# Patient Record
Sex: Female | Born: 1995
Health system: Southern US, Community
[De-identification: ages and names within clinical notes are randomized; demographics above are authoritative.]

## PROBLEM LIST (undated history)

## (undated) DIAGNOSIS — N390 Urinary tract infection, site not specified: Secondary | ICD-10-CM

## (undated) DIAGNOSIS — H539 Unspecified visual disturbance: Secondary | ICD-10-CM

## (undated) DIAGNOSIS — T7840XA Allergy, unspecified, initial encounter: Secondary | ICD-10-CM

## (undated) HISTORY — DX: Unspecified visual disturbance: H53.9

## (undated) HISTORY — DX: Urinary tract infection, site not specified: N39.0

## (undated) HISTORY — DX: Allergy, unspecified, initial encounter: T78.40XA

---
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
## 2016-01-01 ENCOUNTER — Other Ambulatory Visit: Payer: Self-pay | Admitting: Pediatrics

## 2016-01-02 ENCOUNTER — Telehealth: Payer: Self-pay | Admitting: Cardiology

## 2016-01-02 NOTE — Telephone Encounter (Signed)
Patients Mother needs call back-needs letter stating Echo was good and she is cleared-she is leaving for college Monday-please call [phone]

## 2016-01-05 ENCOUNTER — Encounter: Payer: Self-pay | Admitting: Cardiology

## 2016-01-05 ENCOUNTER — Encounter: Payer: Self-pay | Admitting: *Deleted

## 2016-01-05 NOTE — Telephone Encounter (Signed)
To Whom It May Concern,  Ms Betty Davenport evaluated in our clinic and there is currently no contraindication for this young female to start her school program or being involved in any sport activity.  Please call us with any questions.  Warm regards,  Tobias AlexanderKatarina Lujean Ebright Cardiologist [facility]

## 2016-01-05 NOTE — Telephone Encounter (Signed)
Informed the pts Mother, Lorene DyChristie, that Dr Delton SeeNelson wrote this pts clearance letter for school, and this is available for her to pick up at the front desk at her earliest convenience.  Mother verbalized understanding and agrees with this plan.  Mother gracious for all the assistance provided.

## 2016-01-05 NOTE — Telephone Encounter (Signed)
Will route this letter request to Dr Delton SeeNelson to review and advise on and follow-up with the pt and Mother thereafter.

## 2016-01-20 NOTE — Progress Notes (Signed)
I wrote her letter for school already

## 2016-01-25 ENCOUNTER — Other Ambulatory Visit: Payer: Self-pay | Admitting: Pediatrics

## 2016-04-19 ENCOUNTER — Other Ambulatory Visit: Payer: Self-pay | Admitting: Pediatrics

## 2016-06-16 ENCOUNTER — Other Ambulatory Visit: Payer: Self-pay | Admitting: Pediatrics

## 2016-10-26 ENCOUNTER — Other Ambulatory Visit: Payer: Self-pay | Admitting: Pediatrics

## 2016-10-27 NOTE — Telephone Encounter (Signed)
Needs appointment for further refills

## 2016-12-18 ENCOUNTER — Other Ambulatory Visit: Payer: Self-pay | Admitting: Pediatrics

## 2017-05-03 IMAGING — DX DG CHEST 2V
2 series · 2 of 2 positions shown · non-contrast
Comparison: None in PACs

CLINICAL DATA: Childhood asthma

EXAM:
CHEST  2 VIEW

[chest pa]
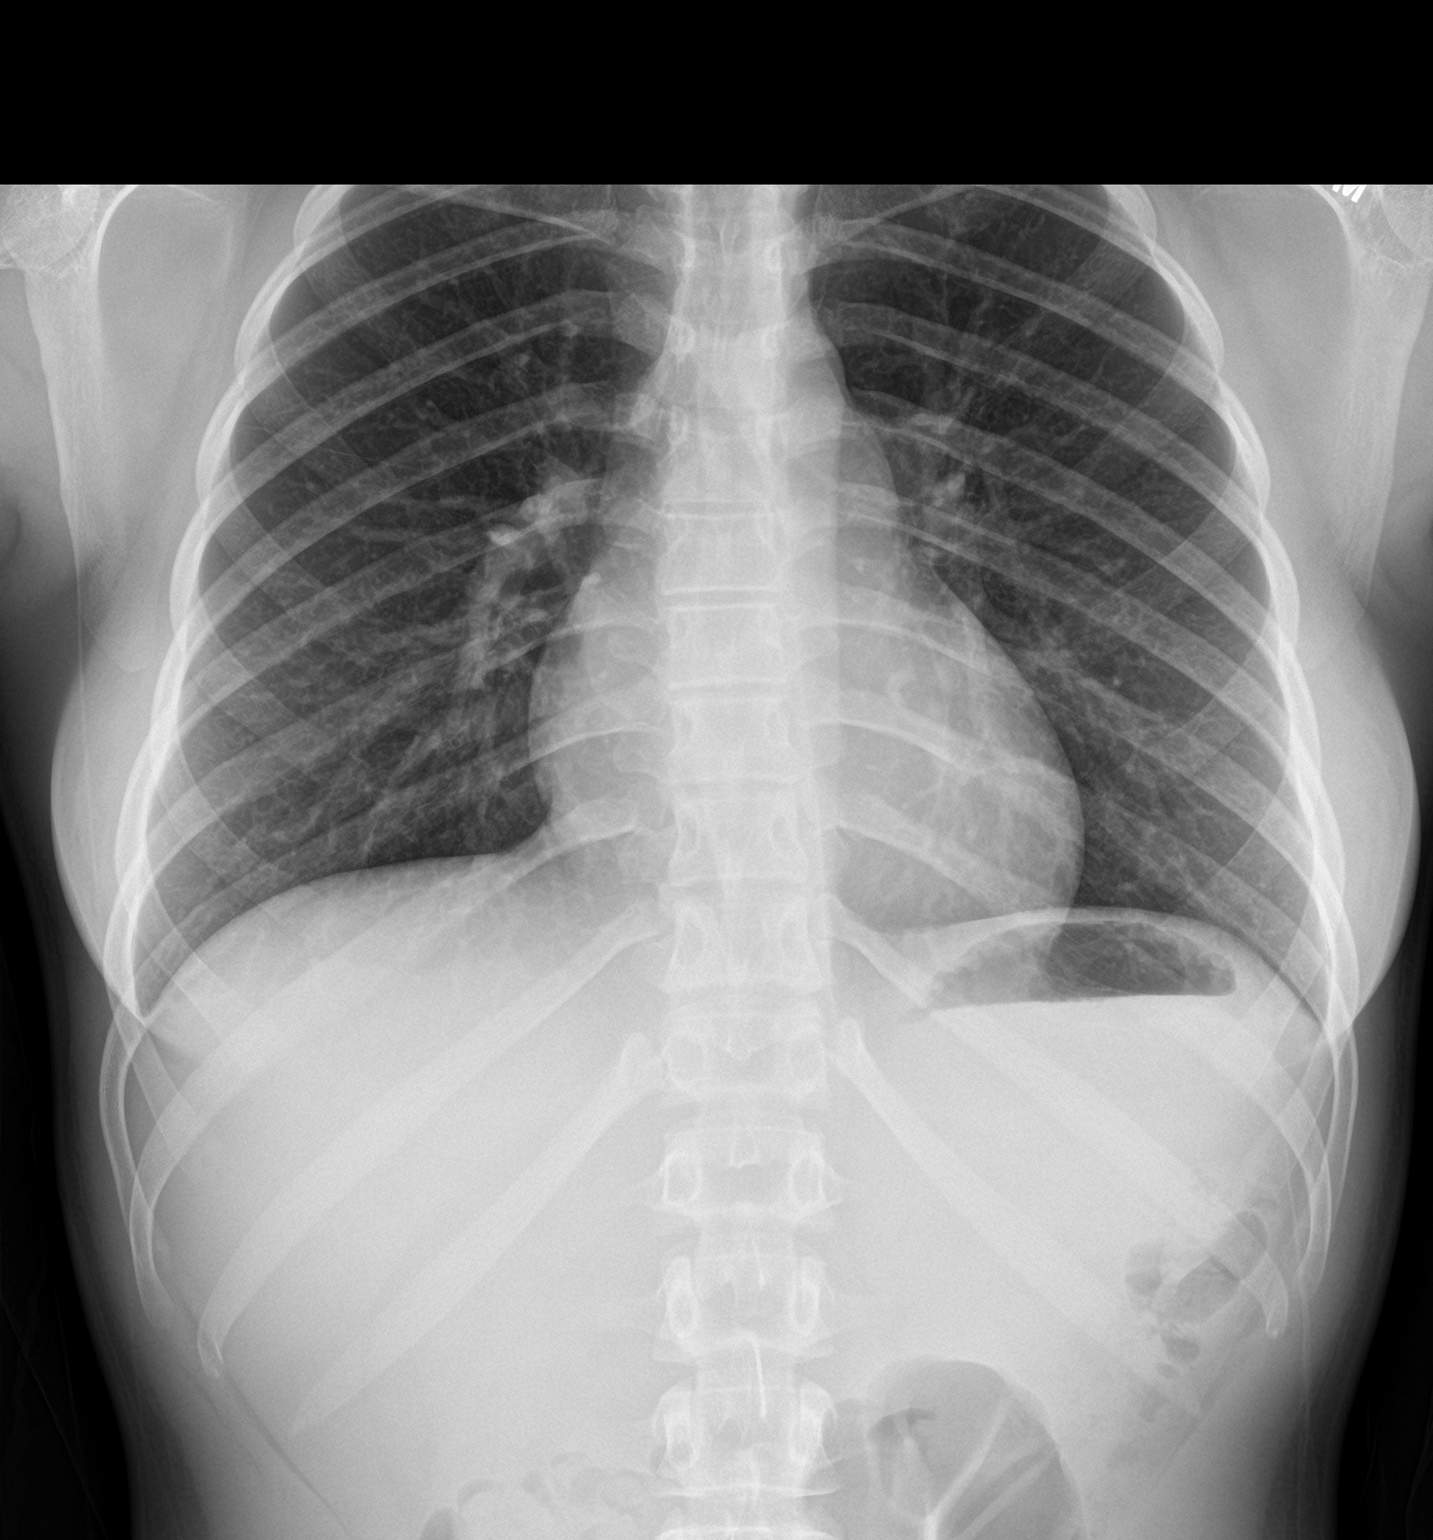

[chest lat]
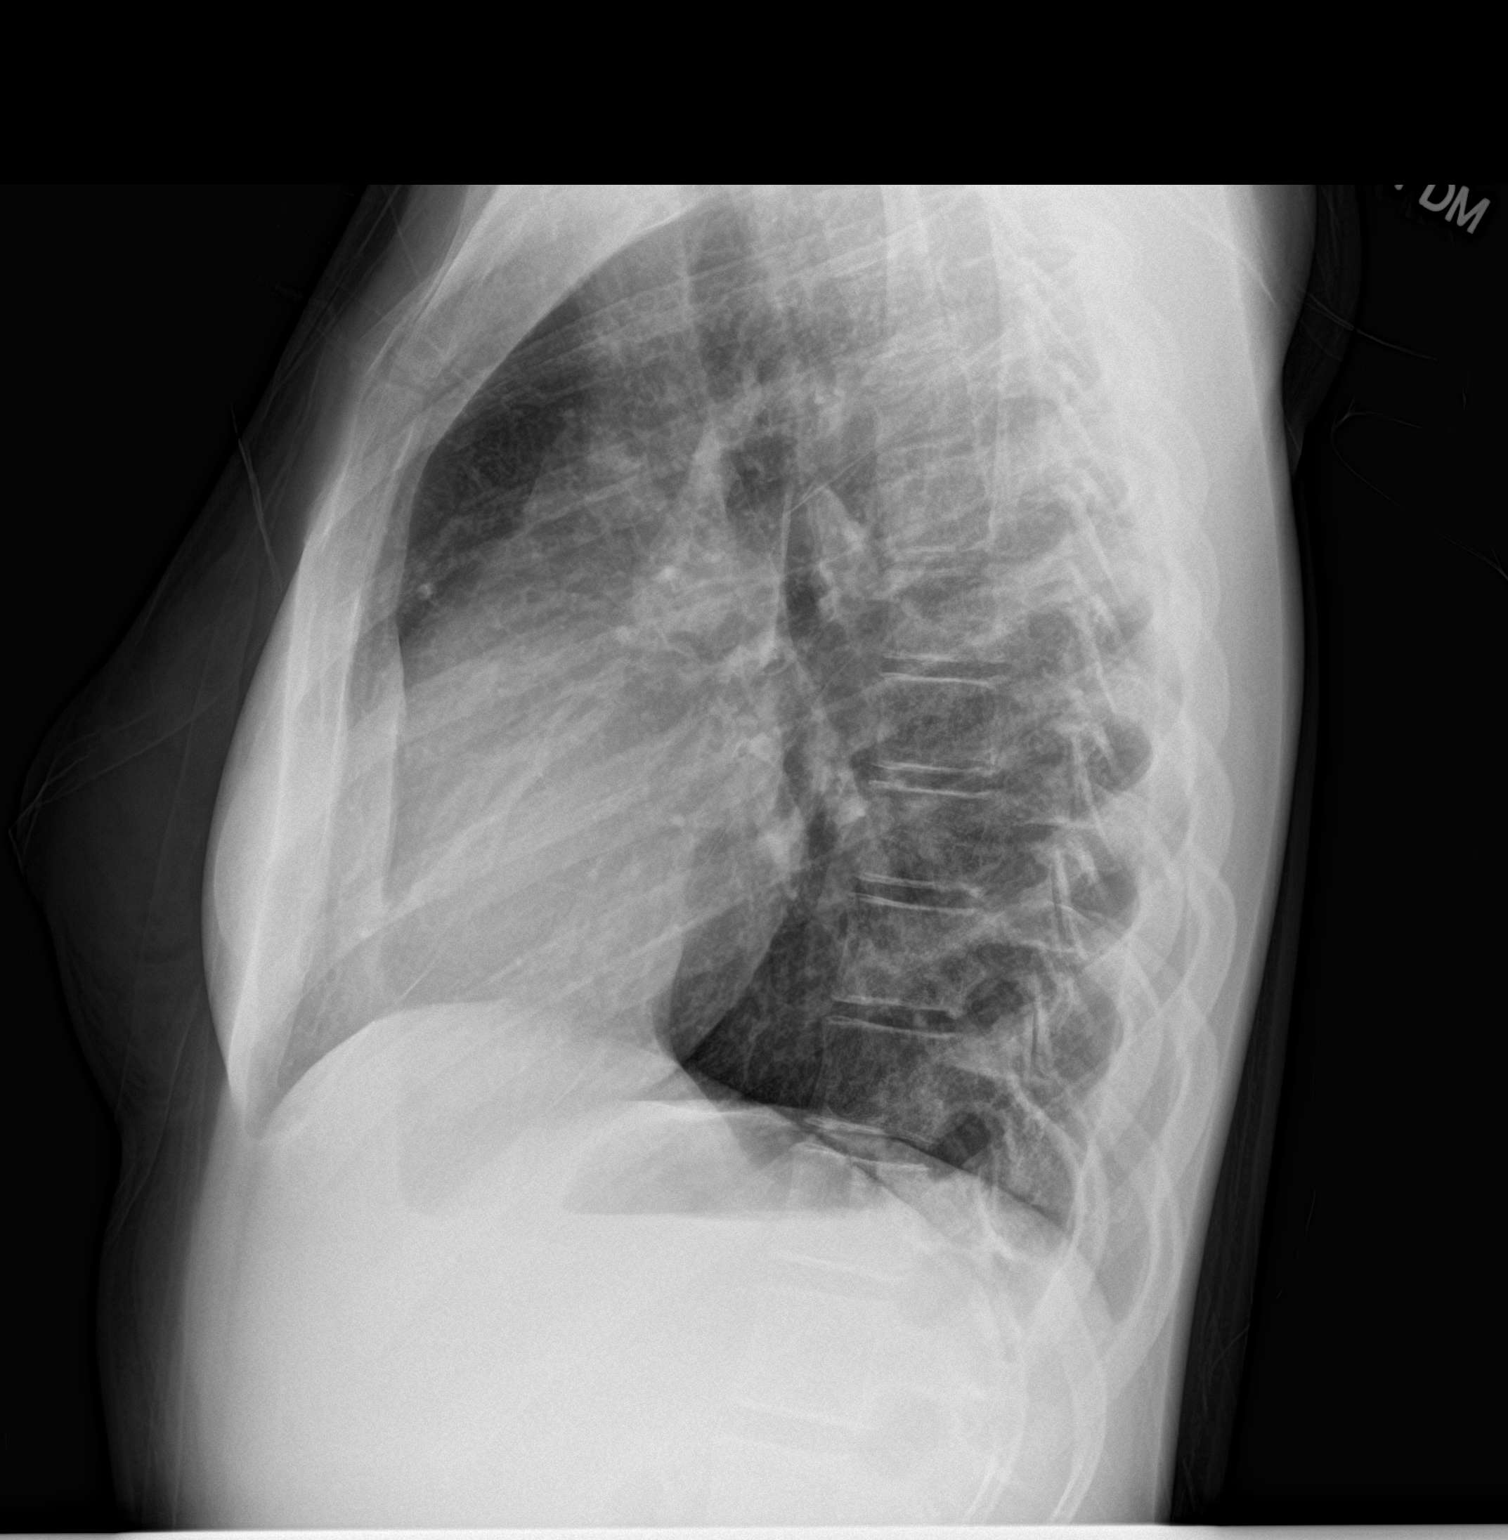

[2 of 2 positions shown; findings below may reference images not displayed]

FINDINGS: The lungs are well-expanded. There is no focal infiltrate. There is
no pleural effusion. The heart and pulmonary vascularity are normal.
The mediastinum is normal in width. The bony thorax is unremarkable.
IMPRESSION: There is no active cardiopulmonary disease.

## 2017-07-19 ENCOUNTER — Ambulatory Visit (INDEPENDENT_AMBULATORY_CARE_PROVIDER_SITE_OTHER): Payer: Self-pay | Admitting: Pediatrics

## 2017-07-19 VITALS — BP 121/72 | HR 86 | Ht 60.24 in | Wt 114.0 lb

## 2017-07-19 DIAGNOSIS — Z3202 Encounter for pregnancy test, result negative: Secondary | ICD-10-CM

## 2017-07-19 DIAGNOSIS — Z113 Encounter for screening for infections with a predominantly sexual mode of transmission: Secondary | ICD-10-CM

## 2017-07-19 DIAGNOSIS — Z3041 Encounter for surveillance of contraceptive pills: Secondary | ICD-10-CM

## 2017-07-19 DIAGNOSIS — K5900 Constipation, unspecified: Secondary | ICD-10-CM

## 2017-07-19 LAB — POCT URINE PREGNANCY: Preg Test, Ur: NEGATIVE

## 2017-07-19 LAB — POCT RAPID HIV: Rapid HIV, POC: NEGATIVE

## 2017-07-19 MED ORDER — POLYETHYLENE GLYCOL 3350 17 GM/SCOOP PO POWD: 1.0000 | Freq: Every day | ORAL | 5 refills | Status: AC

## 2017-07-19 MED ORDER — NORETHIN ACE-ETH ESTRAD-FE 1.5-30 MG-MCG PO TABS: 1.0000 | ORAL_TABLET | Freq: Every day | ORAL | 11 refills | Status: DC

## 2017-07-19 NOTE — Progress Notes (Signed)
THIS RECORD MAY CONTAIN CONFIDENTIAL INFORMATION THAT SHOULD NOT BE RELEASED WITHOUT REVIEW OF THE SERVICE PROVIDER.  Adolescent Medicine Consultation Follow-Up Visit Betty Davenport  is a 22 y.o. female referred by No ref. provider found here today for follow-up.    Previsit planning completed:  yes  Growth Chart Viewed? yes   History was provided by the patient.  PCP Confirmed?  yes  My Chart Activated?   no   HPI:  Betty Davenport is a 22 year old F who presents for constipation and need for birth control refills.   The patient reports that she has been having issues with constipation. Usually stools every other day. Has been taking miralax (1 cap) every other day. When she doesn't take miralax, she stools every other week.  Associated with occassional abdominal pain. No nausea/vomiting. No urinary changes.   She is currently taking OCPs daily. Her cycles have been regular. Occur monthly. Usually last 4-5 days. Heavy bleeding during the first 2 days. No menstrual cramps. She reports running out of OCPs in December 2018. They were prescribed by her PCP.   No LMP recorded. No Known Allergies Outpatient Encounter Medications as of 07/19/2017  Medication Sig  . cetirizine (ZYRTEC) 10 MG tablet TAKE 1 TABLET BY MOUTH EVERY DAY  . cetirizine (ZYRTEC) 10 MG tablet TAKE 1 TABLET BY MOUTH EVERY DAY  . Cholecalciferol (VITAMIN D PO) Take by mouth.  . norethindrone-ethinyl estradiol-iron (JUNEL FE 1.5/30) 1.5-30 MG-MCG tablet Take 1 tablet by mouth daily.  . norethindrone-ethinyl estradiol-iron (JUNEL FE 1.5/30) 1.5-30 MG-MCG tablet Take 1 tablet by mouth daily.  [employer]. OS-CAL CALCIUM + D3 500-200 MG-UNIT TABS TAKE 1 TABLET BY MOUTH DAILY WITH BREAKFAST.  Marland Kitchen. polyethylene glycol powder (GLYCOLAX/MIRALAX) powder Take 255 g by mouth daily.  . [DISCONTINUED] JUNEL FE 1.5/30 1.5-30 MG-MCG tablet TAKE 1 TABLET BY MOUTH DAILY.  . [DISCONTINUED] polyethylene glycol powder (GLYCOLAX/MIRALAX) powder TAKE 17 G BY MOUTH  DAILY.   No facility-administered encounter medications on file as of 07/19/2017.      Patient Active Problem List   Diagnosis Date Noted  . Childhood asthma without complication 12/03/2015  . Vitamin D insufficiency 06/12/2014  . Low serum alkaline phosphatase 02/25/2014  . Endocrine function study abnormality 02/25/2014  . Short stature 02/25/2014  . Allergic rhinitis 04/18/2013  . Family problems 01/20/2011  . Adjustment disorder with depressed mood 01/20/2011    Social History: Lives with: Currently in college and stays on campus. Goes home on holidays. Has a good relationship with brother and mother.   School: [employer] Future Plans:  [employer] Exercise:  goes to fitness center sometimes. Walks on campus  Sports:  none Sleep:  Tries to go to sleep between 11pm - 12 am. Gets about 7 hours of sleep a day. About once a week, she will have issues going to sleep because she has a lot on her mind in regards to school work.   Confidentiality was discussed with the patient and if applicable, with caregiver as well.  Patient's personal or confidential phone number: [phone] Enter confidential phone number in social history in documentation section of social history Tobacco?  no Drugs/ETOH?  no Partner preference?  female Sexually Active?  no  Pregnancy Prevention:  birth control pills, reviewed condoms & plan B Trauma currently or in the past?  no Suicidal or Self-Harm thoughts?   yes, happens about once a month. Talks to her best friend who helps her feel better.Marland Kitchen. Has never formulated  a plan  Guns in the home?  Yes, mom has one for protection     The following portions of the patient's history were reviewed and updated as appropriate: allergies, current medications, past family history, past medical history, past social history, past surgical history and problem list.  Physical Exam:  Vitals:   07/19/17 1512  BP: 121/72  Pulse: 86  Weight: 114 lb  (51.7 kg)  Height: 5' 0.24" (1.53 m)   BP 121/72   Pulse 86   Ht 5' 0.24" (1.53 m)   Wt 114 lb (51.7 kg)   BMI 22.09 kg/m  Body mass index: body mass index is 22.09 kg/m. Growth percentile SmartLinks can only be used for patients less than 70 years old.  Physical Exam  Constitutional: She is oriented to person, place, and time. She appears well-developed and well-nourished.  HENT:  Head: Normocephalic and atraumatic.  Eyes: Conjunctivae and EOM are normal.  Neck: Normal range of motion. Neck supple.  Cardiovascular: Normal rate, regular rhythm, normal heart sounds and intact distal pulses.  No murmur heard. Pulmonary/Chest: Effort normal and breath sounds normal. She has no wheezes. She has no rales.  Abdominal: Soft. Bowel sounds are normal. She exhibits no distension. There is no tenderness. There is no rebound and no guarding.  Musculoskeletal: Normal range of motion. She exhibits no edema.  Lymphadenopathy:    She has no cervical adenopathy.  Neurological: She is alert and oriented to person, place, and time.  Skin: Skin is warm. No rash noted.    Assessment/Plan:  1. Encounter for surveillance of contraceptive pills - Refilled OCPs. Patient is not interested in any other forms of birth control. She is satisfied with OCPs.  - norethindrone-ethinyl estradiol-iron (JUNEL FE 1.5/30) 1.5-30 MG-MCG tablet; Take 1 tablet by mouth daily.  Dispense: 28 tablet; Refill: 11  2. Routine screening for STI (sexually transmitted infection) - POCT Rapid HIV - C. trachomatis/N. gonorrhoeae RNA  3. Pregnancy examination or test, negative result - POCT urine pregnancy  4. Constipation, unspecified constipation type - Refilled miralax and recommended a constipation clean out (provided patient with cleanout information) and recommended a daily maintenance dose of 1-2 caps in 8 oz of water daily for at least 6 months. - Counseled on drinking water and increasing dietary fiber.  - Advised  to follow up with PCP for continued management  - polyethylene glycol powder (GLYCOLAX/MIRALAX) powder; Take 255 g by mouth daily.  Dispense: 255 g; Refill: 5   Follow-up:  Return if symptoms worsen or fail to improve.   Medical decision-making:  > 15 minutes spent, more than 50% of appointment was spent discussing diagnosis and management of symptoms.

## 2017-07-19 NOTE — Patient Instructions (Signed)
You are constipated and need help to clean out the large amount of stool (poop) in the intestine. This guide tells you what medicine to use.  What do I need to know before starting the clean out?  . It will take about 4 to 6 hours to take the medicine.  . After taking the medicine, you should have a large stool within 24 hours.  . Plan to stay close to a bathroom until the stool has passed. . After the intestine is cleaned out, you will need to take a daily medicine.   Remember:  Constipation can last a long time. It may take 6 to 12 months for you to get back to regular bowel movements (BMs). Be patient. Things will get better slowly over time.  If you have questions, call your doctor at this number:     [phone]   When should you start the clean out?  . Start the home clean out on a Friday afternoon or some other time when you will be home (and not at school).  . Start between 2:00 and 4:00 in the afternoon.  . You should have almost clear liquid stools by the end of the next day. . If the medicine does not work or you don't know if it worked, Physicist, medicalcall your doctor or nurse.  What medicine do I need to take?  You need to take Miralax, a powder that you mix in a clear liquid.  Follow these steps: ?    Stir the Miralax powder into water, juice, or Gatorade. Your Miralax dose is: 8 capfuls of Miralax powder in 32-64 ounces of liquid ?    Drink 4 to 8 ounces every 30 minutes. It will take 4 to 6 hours to finish the medicine. ?    After the medicine is gone, drink more water or juice. This will help with the cleanout.   -     If the medicine gives you an upset stomach, slow down or stop.   Does I need to keep taking medicine?                                                                                                      After the clean out, you will take a daily (maintenance) medicine for at least 6 months. Your Miralax dose is:  1-2  capful of powder in 8 ounces of liquid  every day  You should go to the doctor for follow-up appointments as directed.  What if I get constipated again?  Some people need to have the clean out more than one time for the problem to go away. Contact your doctor to ask if you should repeat the clean out. It is OK to do it again, but you should wait at least a week before repeating the clean out.    Will I have any problems with the medicine?   You may have stomach pain or cramping during the clean out. This might mean you have to go to the bathroom.   Take some time  to sit on the toilet. The pain will go away when the stool is gone. You may want to read while you wait. A warm bath may also help.   What should I eat and drink?  Drink lots of water and juice. Fruits and vegetables are good foods to eat. Try to avoid greasy and fatty foods.

## 2017-07-20 LAB — C. TRACHOMATIS/N. GONORRHOEAE RNA
C. TRACHOMATIS RNA, TMA: NOT DETECTED
N. gonorrhoeae RNA, TMA: NOT DETECTED

## 2017-08-22 ENCOUNTER — Other Ambulatory Visit: Payer: Self-pay | Admitting: Pediatrics

## 2018-01-17 DIAGNOSIS — Z0289 Encounter for other administrative examinations: Secondary | ICD-10-CM | POA: Diagnosis not present

## 2018-01-17 DIAGNOSIS — Z130 Encounter for screening for diseases of the blood and blood-forming organs and certain disorders involving the immune mechanism: Secondary | ICD-10-CM | POA: Diagnosis not present

## 2018-03-10 DIAGNOSIS — Z23 Encounter for immunization: Secondary | ICD-10-CM | POA: Diagnosis not present

## 2018-04-29 ENCOUNTER — Encounter (HOSPITAL_COMMUNITY): Payer: Self-pay

## 2018-04-29 ENCOUNTER — Ambulatory Visit (HOSPITAL_COMMUNITY)
Admission: EM | Admit: 2018-04-29 | Discharge: 2018-04-29 | Disposition: A | Discharge status: Home / Self Care | Payer: 59 | Attending: Internal Medicine | Admitting: Internal Medicine

## 2018-04-29 DIAGNOSIS — J029 Acute pharyngitis, unspecified: Secondary | ICD-10-CM | POA: Diagnosis not present

## 2018-04-29 DIAGNOSIS — J039 Acute tonsillitis, unspecified: Secondary | ICD-10-CM

## 2018-04-29 LAB — POCT RAPID STREP A: Streptococcus, Group A Screen (Direct): NEGATIVE

## 2018-04-29 MED ORDER — FLUTICASONE PROPIONATE 50 MCG/ACT NA SUSP: 2.0000 | Freq: Every day | NASAL | 0 refills | Status: DC

## 2018-04-29 MED ORDER — AMOXICILLIN-POT CLAVULANATE 875-125 MG PO TABS: 1.0000 | ORAL_TABLET | Freq: Two times a day (BID) | ORAL | 0 refills | Status: DC

## 2018-04-29 MED ORDER — IPRATROPIUM BROMIDE 0.06 % NA SOLN: 2.0000 | Freq: Four times a day (QID) | NASAL | 0 refills | Status: DC

## 2018-04-29 NOTE — ED Provider Notes (Signed)
[facility]    CSN: [id number] Arrival date & time: 04/29/18  1051     History   Chief Complaint Chief Complaint  Patient presents with  . Sore Throat    HPI Betty Davenport is a 22 y.o. female.   22 year old female comes in for a week history of URI symptoms.  States started out with rhinorrhea, nasal congestion, cough, sore throat.  Had fever that has since resolved.  Other symptoms have slowly been improving, but has had increase in sore throat.  States noticed white patches to the tonsils.  She denies trouble breathing, trouble swallowing, trismus, tripoding, drooling.  OTC cold medication without relief.  No obvious sick contact.  Never smoker.     Past Medical History:  Diagnosis Date  . Allergy   . Urinary tract infection    3 months old  . Vision abnormalities    Farsighted    Patient Active Problem List   Diagnosis Date Noted  . Childhood asthma without complication 12/03/2015  . Vitamin D insufficiency 06/12/2014  . Low serum alkaline phosphatase 02/25/2014  . Endocrine function study abnormality 02/25/2014  . Short stature 02/25/2014  . Allergic rhinitis 04/18/2013  . Family problems 01/20/2011  . Adjustment disorder with depressed mood 01/20/2011    History reviewed. No pertinent surgical history.  OB History   None      Home Medications    Prior to Admission medications   Medication Sig Start Date End Date Taking? Authorizing Provider  amoxicillin-clavulanate (AUGMENTIN) 875-125 MG tablet Take 1 tablet by mouth every 12 (twelve) hours. 04/29/18   Cathie Hoops, Amberleigh Gerken V, PA-C  cetirizine (ZYRTEC) 10 MG tablet TAKE 1 TABLET BY MOUTH EVERY DAY 08/22/17   Klett, Pascal Lux, NP  Cholecalciferol (VITAMIN D PO) Take by mouth.    [provider]  fluticasone (FLONASE) 50 MCG/ACT nasal spray Place 2 sprays into both nostrils daily. 04/29/18   Cathie Hoops, Alix Stowers V, PA-C  ipratropium (ATROVENT) 0.06 % nasal spray Place 2 sprays into both nostrils 4 (four) times daily.  04/29/18   Cathie Hoops, Lititia Sen V, PA-C  norethindrone-ethinyl estradiol-iron (JUNEL FE 1.5/30) 1.5-30 MG-MCG tablet Take 1 tablet by mouth daily. 08/21/15   Myra Rude, MD  norethindrone-ethinyl estradiol-iron (JUNEL FE 1.5/30) 1.5-30 MG-MCG tablet Take 1 tablet by mouth daily. 07/19/17   Hollice Gong, MD  OS-CAL CALCIUM + D3 500-200 MG-UNIT TABS TAKE 1 TABLET BY MOUTH DAILY WITH BREAKFAST. 06/16/16   Klett, Pascal Lux, NP  polyethylene glycol powder (GLYCOLAX/MIRALAX) powder Take 255 g by mouth daily. 07/19/17   Hollice Gong, MD    Family History Family History  Problem Relation Age of Onset  . Hypertension Mother   . Depression Mother   . Anxiety disorder Mother   . Cancer Mother        cervical cancer, remission  . Drug abuse Father        step father  . Alcohol abuse Father        step-father  . Sarcoidosis Maternal Grandmother   . Dementia Maternal Grandmother   . Hypothyroidism Maternal Grandmother   . Early death Maternal Grandmother        early onset dementia, Alzheimers  . Heart disease Maternal Grandmother   . Hypertension Maternal Grandmother   . Stroke Maternal Grandfather   . Arthritis Other   . Asthma Neg Hx   . Seizures Neg Hx   . Diabetes Neg Hx   . Hyperlipidemia Neg Hx   . Birth defects  Neg Hx   . COPD Neg Hx   . Hearing loss Neg Hx   . Kidney disease Neg Hx   . Learning disabilities Neg Hx   . Mental illness Neg Hx   . Mental retardation Neg Hx   . Miscarriages / Stillbirths Neg Hx   . Vision loss Neg Hx   . Varicose Veins Neg Hx     Social History Social History   Tobacco Use  . Smoking status: Never Smoker  . Smokeless tobacco: Never Used  Substance Use Topics  . Alcohol use: No    Alcohol/week: 0.0 standard drinks  . Drug use: No     Allergies   Patient has no known allergies.   Review of Systems Review of Systems  Reason unable to perform ROS: See HPI as above.     Physical Exam Triage Vital Signs ED Triage Vitals  Enc Vitals  Group     BP 04/29/18 1207 119/64     Pulse Rate 04/29/18 1207 66     Resp 04/29/18 1207 16     Temp 04/29/18 1207 98.1 F (36.7 C)     Temp Source 04/29/18 1207 Oral     SpO2 04/29/18 1207 100 %     Weight --      Height --      Head Circumference --      Peak Flow --      Pain Score 04/29/18 1208 5     Pain Loc --      Pain Edu? --      Excl. in [place]? --    No data found.  Updated Vital Signs BP 119/64 (BP Location: Right Arm)   Pulse 66   Temp 98.1 F (36.7 C) (Oral)   Resp 16   LMP 04/21/2018   SpO2 100%   Physical Exam  Constitutional: She is oriented to person, place, and time. She appears well-developed and well-nourished. No distress.  HENT:  Head: Normocephalic and atraumatic.  Right Ear: Tympanic membrane, external ear and ear canal normal. Tympanic membrane is not erythematous and not bulging.  Left Ear: Tympanic membrane, external ear and ear canal normal. Tympanic membrane is not erythematous and not bulging.  Nose: Nose normal. Right sinus exhibits no maxillary sinus tenderness and no frontal sinus tenderness. Left sinus exhibits no maxillary sinus tenderness and no frontal sinus tenderness.  Mouth/Throat: Uvula is midline, oropharynx is clear and moist and mucous membranes are normal. Tonsils are 2+ on the right. Tonsils are 1+ on the left. Tonsillar exudate (right).  Eyes: Pupils are equal, round, and reactive to light. Conjunctivae are normal.  Neck: Normal range of motion. Neck supple.  Cardiovascular: Normal rate, regular rhythm and normal heart sounds. Exam reveals no gallop and no friction rub.  No murmur heard. Pulmonary/Chest: Effort normal and breath sounds normal. No respiratory distress. She has no decreased breath sounds. She has no wheezes. She has no rhonchi. She has no rales.  Lymphadenopathy:    She has no cervical adenopathy.  Neurological: She is alert and oriented to person, place, and time.  Skin: Skin is warm and dry.  Psychiatric: She  has a normal mood and affect. Her behavior is normal. Judgment normal.     UC Treatments / Results  Labs (all labs ordered are listed, but only abnormal results are displayed) Labs Reviewed  CULTURE, GROUP A STREP [facility]  POCT RAPID STREP A    EKG None  Radiology No results found.  Procedures  Procedures (including critical care time)  Medications Ordered in UC Medications - No data to display  Initial Impression / Assessment and Plan / UC Course  I have reviewed the triage vital signs and the nursing notes.  Pertinent labs & imaging results that were available during my care of the patient were reviewed by me and considered in my medical decision making (see chart for details).    Rapid strep negative.  However, given unilateral enlargement of tonsils with exudates, will cover for tonsillitis with Augmentin.  Other symptomatic treatment discussed.  Push fluids.  Return precautions given.  Patient and mother expresses understanding and agrees to plan.  Final Clinical Impressions(s) / UC Diagnoses   Final diagnoses:  Tonsillitis    ED Prescriptions    Medication Sig Dispense Auth. Provider   amoxicillin-clavulanate (AUGMENTIN) 875-125 MG tablet Take 1 tablet by mouth every 12 (twelve) hours. 14 tablet Martha Soltys V, PA-C   fluticasone (FLONASE) 50 MCG/ACT nasal spray Place 2 sprays into both nostrils daily. 1 g Giavonni Fonder V, PA-C   ipratropium (ATROVENT) 0.06 % nasal spray Place 2 sprays into both nostrils 4 (four) times daily. 15 mL Threasa Alpha, [place] 04/29/18 1246

## 2018-04-29 NOTE — ED Triage Notes (Signed)
Pt present throat pain since after Thanksgiving.  Pt states she spotted white patches in the back of her throat.

## 2018-04-29 NOTE — Discharge Instructions (Signed)
Rapid strep negative. However, given your exam, will cover you empirically for bacterial infection with augmentin. As discussed, symptoms can still be due to viral illness/ drainage down your throat. This usually takes 7-10 days to resolve. Start flonase and atrovent for nasal congestion/drainage. You can use over the counter nasal saline rinse such as neti pot for nasal congestion. Monitor for any worsening of symptoms, swelling of the throat, trouble breathing, trouble swallowing, leaning forward to breath, drooling, go to the emergency department for further evaluation needed.  For sore throat/cough try using a honey-based tea. Use 3 teaspoons of honey with juice squeezed from half lemon. Place shaved pieces of ginger into 1/2-1 cup of water and warm over stove top. Then mix the ingredients and repeat every 4 hours as needed.

## 2018-05-02 LAB — CULTURE, GROUP A STREP (THRC)

## 2018-05-15 DIAGNOSIS — G4709 Other insomnia: Secondary | ICD-10-CM | POA: Insufficient documentation

## 2018-05-15 DIAGNOSIS — Z23 Encounter for immunization: Secondary | ICD-10-CM | POA: Diagnosis not present

## 2018-05-15 DIAGNOSIS — Z01419 Encounter for gynecological examination (general) (routine) without abnormal findings: Secondary | ICD-10-CM | POA: Diagnosis not present

## 2018-05-15 DIAGNOSIS — E559 Vitamin D deficiency, unspecified: Secondary | ICD-10-CM | POA: Diagnosis not present

## 2018-05-15 DIAGNOSIS — K5909 Other constipation: Secondary | ICD-10-CM | POA: Diagnosis not present

## 2018-05-15 DIAGNOSIS — Z136 Encounter for screening for cardiovascular disorders: Secondary | ICD-10-CM | POA: Diagnosis not present

## 2018-05-15 DIAGNOSIS — Z6822 Body mass index (BMI) 22.0-22.9, adult: Secondary | ICD-10-CM | POA: Diagnosis not present

## 2018-05-15 DIAGNOSIS — Z13220 Encounter for screening for lipoid disorders: Secondary | ICD-10-CM | POA: Diagnosis not present

## 2018-05-15 DIAGNOSIS — Z0000 Encounter for general adult medical examination without abnormal findings: Secondary | ICD-10-CM | POA: Diagnosis not present

## 2018-06-01 DIAGNOSIS — K649 Unspecified hemorrhoids: Secondary | ICD-10-CM | POA: Diagnosis not present

## 2018-06-01 DIAGNOSIS — K581 Irritable bowel syndrome with constipation: Secondary | ICD-10-CM | POA: Diagnosis not present

## 2018-07-01 DIAGNOSIS — J039 Acute tonsillitis, unspecified: Secondary | ICD-10-CM | POA: Diagnosis not present

## 2018-07-13 DIAGNOSIS — K5901 Slow transit constipation: Secondary | ICD-10-CM | POA: Diagnosis not present

## 2018-07-13 DIAGNOSIS — J0300 Acute streptococcal tonsillitis, unspecified: Secondary | ICD-10-CM | POA: Diagnosis not present

## 2018-11-07 DIAGNOSIS — K5901 Slow transit constipation: Secondary | ICD-10-CM | POA: Diagnosis not present

## 2018-11-07 DIAGNOSIS — J0300 Acute streptococcal tonsillitis, unspecified: Secondary | ICD-10-CM | POA: Diagnosis not present

## 2018-11-20 DIAGNOSIS — H5213 Myopia, bilateral: Secondary | ICD-10-CM | POA: Diagnosis not present

## 2019-01-30 DIAGNOSIS — R6889 Other general symptoms and signs: Secondary | ICD-10-CM | POA: Diagnosis not present

## 2019-01-30 DIAGNOSIS — Z0000 Encounter for general adult medical examination without abnormal findings: Secondary | ICD-10-CM | POA: Diagnosis not present

## 2019-05-31 ENCOUNTER — Encounter: Payer: Self-pay | Admitting: Emergency Medicine

## 2019-05-31 ENCOUNTER — Other Ambulatory Visit: Payer: Self-pay

## 2019-05-31 ENCOUNTER — Ambulatory Visit
Admission: EM | Admit: 2019-05-31 | Discharge: 2019-05-31 | Disposition: A | Discharge status: Home / Self Care | Payer: No Typology Code available for payment source

## 2019-05-31 DIAGNOSIS — Z711 Person with feared health complaint in whom no diagnosis is made: Secondary | ICD-10-CM

## 2019-05-31 NOTE — ED Provider Notes (Signed)
[facility]    CSN: [id number] Arrival date & time: 05/31/19  1646      History   Chief Complaint Chief Complaint  Patient presents with  . Fever    HPI Shatika Grinnell is a 24 y.o. female with history of allergies presenting for subjective fever last night.  States her mother gave her half of an antibiotic with improvement of subjective fever.  Was unable to check her temperature.  No other symptoms.  Has been feeling well today.  No known sick contact.   Past Medical History:  Diagnosis Date  . Allergy   . Urinary tract infection    3 months old  . Vision abnormalities    Farsighted    Patient Active Problem List   Diagnosis Date Noted  . Childhood asthma without complication 12/03/2015  . Vitamin D insufficiency 06/12/2014  . Low serum alkaline phosphatase 02/25/2014  . Endocrine function study abnormality 02/25/2014  . Short stature 02/25/2014  . Allergic rhinitis 04/18/2013  . Family problems 01/20/2011  . Adjustment disorder with depressed mood 01/20/2011    History reviewed. No pertinent surgical history.  OB History   No obstetric history on file.      Home Medications    Prior to Admission medications   Medication Sig Start Date End Date Taking? Authorizing Provider  amoxicillin-clavulanate (AUGMENTIN) 875-125 MG tablet Take 1 tablet by mouth every 12 (twelve) hours. 04/29/18   Cathie Hoops, Amy V, PA-C  cetirizine (ZYRTEC) 10 MG tablet TAKE 1 TABLET BY MOUTH EVERY DAY 08/22/17   Klett, Pascal Lux, NP  Cholecalciferol (VITAMIN D PO) Take by mouth.    [provider]  fluticasone (FLONASE) 50 MCG/ACT nasal spray Place 2 sprays into both nostrils daily. 04/29/18   Cathie Hoops, Amy V, PA-C  ipratropium (ATROVENT) 0.06 % nasal spray Place 2 sprays into both nostrils 4 (four) times daily. 04/29/18   Cathie Hoops, Amy V, PA-C  norethindrone-ethinyl estradiol-iron (JUNEL FE 1.5/30) 1.5-30 MG-MCG tablet Take 1 tablet by mouth daily. 08/21/15   Myra Rude, MD    norethindrone-ethinyl estradiol-iron (JUNEL FE 1.5/30) 1.5-30 MG-MCG tablet Take 1 tablet by mouth daily. 07/19/17   Hollice Gong, MD  OS-CAL CALCIUM + D3 500-200 MG-UNIT TABS TAKE 1 TABLET BY MOUTH DAILY WITH BREAKFAST. 06/16/16   Klett, Pascal Lux, NP  polyethylene glycol powder (GLYCOLAX/MIRALAX) powder Take 255 g by mouth daily. 07/19/17   Hollice Gong, MD    Family History Family History  Problem Relation Age of Onset  . Hypertension Mother   . Depression Mother   . Anxiety disorder Mother   . Cancer Mother        cervical cancer, remission  . Drug abuse Father        step father  . Alcohol abuse Father        step-father  . Sarcoidosis Maternal Grandmother   . Dementia Maternal Grandmother   . Hypothyroidism Maternal Grandmother   . Early death Maternal Grandmother        early onset dementia, Alzheimers  . Heart disease Maternal Grandmother   . Hypertension Maternal Grandmother   . Stroke Maternal Grandfather   . Arthritis Other   . Asthma Neg Hx   . Seizures Neg Hx   . Diabetes Neg Hx   . Hyperlipidemia Neg Hx   . Birth defects Neg Hx   . COPD Neg Hx   . Hearing loss Neg Hx   . Kidney disease Neg Hx   . Learning disabilities  Neg Hx   . Mental illness Neg Hx   . Mental retardation Neg Hx   . Miscarriages / Stillbirths Neg Hx   . Vision loss Neg Hx   . Varicose Veins Neg Hx     Social History Social History   Tobacco Use  . Smoking status: Never Smoker  . Smokeless tobacco: Never Used  Substance Use Topics  . Alcohol use: No    Alcohol/week: 0.0 standard drinks  . Drug use: No     Allergies   Patient has no known allergies.   Review of Systems Review of Systems  Constitutional: Positive for fever. Negative for fatigue.       Subjective, resolved  HENT: Negative for congestion, dental problem, ear pain, facial swelling, hearing loss, sinus pain, sore throat, trouble swallowing and voice change.   Eyes: Negative for photophobia, pain and visual  disturbance.  Respiratory: Negative for cough and shortness of breath.   Cardiovascular: Negative for chest pain and palpitations.  Gastrointestinal: Negative for diarrhea and vomiting.  Musculoskeletal: Negative for arthralgias and myalgias.  Neurological: Negative for dizziness and headaches.     Physical Exam Triage Vital Signs ED Triage Vitals  Enc Vitals Group     BP      Pulse      Resp      Temp      Temp src      SpO2      Weight      Height      Head Circumference      Peak Flow      Pain Score      Pain Loc      Pain Edu?      Excl. in [place]?    No data found.  Updated Vital Signs BP 129/85 (BP Location: Left Arm)   Pulse 84   Temp 98.3 F (36.8 C) (Temporal)   Resp 16   LMP 05/24/2019   SpO2 99%   Visual Acuity Right Eye Distance:   Left Eye Distance:   Bilateral Distance:    Right Eye Near:   Left Eye Near:    Bilateral Near:     Physical Exam Constitutional:      General: She is not in acute distress. HENT:     Head: Normocephalic and atraumatic.  Eyes:     General: No scleral icterus.    Pupils: Pupils are equal, round, and reactive to light.  Cardiovascular:     Rate and Rhythm: Normal rate and regular rhythm.  Pulmonary:     Effort: Pulmonary effort is normal. No respiratory distress.     Breath sounds: No wheezing.  Skin:    Capillary Refill: Capillary refill takes less than 2 seconds.     Coloration: Skin is not jaundiced or pale.  Neurological:     Mental Status: She is alert and oriented to person, place, and time.      UC Treatments / Results  Labs (all labs ordered are listed, but only abnormal results are displayed) Labs Reviewed - No data to display  EKG   Radiology No results found.  Procedures Procedures (including critical care time)  Medications Ordered in UC Medications - No data to display  Initial Impression / Assessment and Plan / UC Course  I have reviewed the triage vital signs and the nursing  notes.  Pertinent labs & imaging results that were available during my care of the patient were reviewed by me and considered in my  medical decision making (see chart for details).     Patient afebrile, hemodynamically stable, nontoxic, without symptoms.  Educated patient on avoidance of antibiotic use without prescriptions outlined below.  Additional testing deferred at this time.  Return precautions discussed, patient verbalized understanding and is agreeable to plan. Final Clinical Impressions(s) / UC Diagnoses   Final diagnoses:  Worried well     Discharge Instructions     No need for covid testing at this time. Avoid antibiotic use unless prescribed to you. Return for fever, cough, difficulty breathing.    ED Prescriptions    None     PDMP not reviewed this encounter.   [place], [place], [place] 05/31/19 1714

## 2019-05-31 NOTE — ED Notes (Signed)
Patient able to ambulate independently  

## 2019-05-31 NOTE — ED Triage Notes (Signed)
Pt presents to [facility] for assessment of fever yesterday (no thermometer, mom states she was burning up), and then she took two doses (half each) of a random antibiotic her mom had on hand.  Denies any urinary symptoms, denies URI symptoms, denies abdominal pain.

## 2019-05-31 NOTE — Discharge Instructions (Signed)
No need for covid testing at this time. Avoid antibiotic use unless prescribed to you. Return for fever, cough, difficulty breathing.

## 2019-06-28 ENCOUNTER — Other Ambulatory Visit: Payer: Self-pay

## 2019-06-29 ENCOUNTER — Encounter: Payer: Self-pay | Admitting: Family Medicine

## 2019-06-29 ENCOUNTER — Ambulatory Visit (INDEPENDENT_AMBULATORY_CARE_PROVIDER_SITE_OTHER): Payer: No Typology Code available for payment source | Admitting: Family Medicine

## 2019-06-29 VITALS — BP 108/62 | HR 92 | Temp 96.2°F | Ht 61.0 in | Wt 113.4 lb

## 2019-06-29 DIAGNOSIS — Z0000 Encounter for general adult medical examination without abnormal findings: Secondary | ICD-10-CM | POA: Diagnosis not present

## 2019-06-29 NOTE — Patient Instructions (Addendum)
Health Maintenance, Female Adopting a healthy lifestyle and getting preventive care are important in promoting health and wellness. Ask your health care provider about:  The right schedule for you to have regular tests and exams.  Things you can do on your own to prevent diseases and keep yourself healthy. What should I know about diet, weight, and exercise? Eat a healthy diet   Eat a diet that includes plenty of vegetables, fruits, low-fat dairy products, and lean protein.  Do not eat a lot of foods that are high in solid fats, added sugars, or sodium. Maintain a healthy weight Body mass index (BMI) is used to identify weight problems. It estimates body fat based on height and weight. Your health care provider can help determine your BMI and help you achieve or maintain a healthy weight. Get regular exercise Get regular exercise. This is one of the most important things you can do for your health. Most adults should:  Exercise for at least 150 minutes each week. The exercise should increase your heart rate and make you sweat (moderate-intensity exercise).  Do strengthening exercises at least twice a week. This is in addition to the moderate-intensity exercise.  Spend less time sitting. Even light physical activity can be beneficial. Watch cholesterol and blood lipids Have your blood tested for lipids and cholesterol at 23 years of age, then have this test every 5 years. Have your cholesterol levels checked more often if:  Your lipid or cholesterol levels are high.  You are older than 23 years of age.  You are at high risk for heart disease. What should I know about cancer screening? Depending on your health history and family history, you may need to have cancer screening at various ages. This may include screening for:  Breast cancer.  Cervical cancer.  Colorectal cancer.  Skin cancer.  Lung cancer. What should I know about heart disease, diabetes, and high blood  pressure? Blood pressure and heart disease  High blood pressure causes heart disease and increases the risk of stroke. This is more likely to develop in people who have high blood pressure readings, are of African descent, or are overweight.  Have your blood pressure checked: ? Every 3-5 years if you are 54-62 years of age. ? Every year if you are 93 years old or older. Diabetes Have regular diabetes screenings. This checks your fasting blood sugar level. Have the screening done:  Once every three years after age 69 if you are at a normal weight and have a low risk for diabetes.  More often and at a younger age if you are overweight or have a high risk for diabetes. What should I know about preventing infection? Hepatitis B If you have a higher risk for hepatitis B, you should be screened for this virus. Talk with your health care provider to find out if you are at risk for hepatitis B infection. Hepatitis C Testing is recommended for:  Everyone born from [birth year] through 1964.  Anyone with known risk factors for hepatitis C. Sexually transmitted infections (STIs)  Get screened for STIs, including gonorrhea and chlamydia, if: ? You are sexually active and are younger than 23 years of age. ? You are older than 23 years of age and your health care provider tells you that you are at risk for this type of infection. ? Your sexual activity has changed since you were last screened, and you are at increased risk for chlamydia or gonorrhea. Ask your health care provider if  you are at risk.  Ask your health care provider about whether you are at high risk for HIV. Your health care provider may recommend a prescription medicine to help prevent HIV infection. If you choose to take medicine to prevent HIV, you should first get tested for HIV. You should then be tested every 3 months for as long as you are taking the medicine. Pregnancy  If you are about to stop having your period (premenopausal) and  you may become pregnant, seek counseling before you get pregnant.  Take 400 to 800 micrograms (mcg) of folic acid every day if you become pregnant.  Ask for birth control (contraception) if you want to prevent pregnancy. Osteoporosis and menopause Osteoporosis is a disease in which the bones lose minerals and strength with aging. This can result in bone fractures. If you are 9 years old or older, or if you are at risk for osteoporosis and fractures, ask your health care provider if you should:  Be screened for bone loss.  Take a calcium or vitamin D supplement to lower your risk of fractures.  Be given hormone replacement therapy (HRT) to treat symptoms of menopause. Follow these instructions at home: Lifestyle  Do not use any products that contain nicotine or tobacco, such as cigarettes, e-cigarettes, and chewing tobacco. If you need help quitting, ask your health care provider.  Do not use street drugs.  Do not share needles.  Ask your health care provider for help if you need support or information about quitting drugs. Alcohol use  Do not drink alcohol if: ? Your health care provider tells you not to drink. ? You are pregnant, may be pregnant, or are planning to become pregnant.  If you drink alcohol: ? Limit how much you use to 0-1 drink a day. ? Limit intake if you are breastfeeding.  Be aware of how much alcohol is in your drink. In the [place] one drink equals one 12 oz bottle of beer (355 mL), one 5 oz glass of wine (148 mL), or one 1 oz glass of hard liquor (44 mL). General instructions  Schedule regular health, dental, and eye exams.  Stay current with your vaccines.  Tell your health care provider if: ? You often feel depressed. ? You have ever been abused or do not feel safe at home. Summary  Adopting a healthy lifestyle and getting preventive care are important in promoting health and wellness.  Follow your health care provider's instructions about healthy  diet, exercising, and getting tested or screened for diseases.  Follow your health care provider's instructions on monitoring your cholesterol and blood pressure. This information is not intended to replace advice given to you by your health care provider. Make sure you discuss any questions you have with your health care provider. Document Revised: 05/03/2018 Document Reviewed: 05/03/2018 Elsevier Patient Education  2020 [place] 93-58 Years Old, Female Preventive care refers to visits with your health care provider and lifestyle choices that can promote health and wellness. This includes:  A yearly physical exam. This may also be called an annual well check.  Regular dental visits and eye exams.  Immunizations.  Screening for certain conditions.  Healthy lifestyle choices, such as eating a healthy diet, getting regular exercise, not using drugs or products that contain nicotine and tobacco, and limiting alcohol use. What can I expect for my preventive care visit? Physical exam Your health care provider will check your:  Height and weight. This may be used  to calculate body mass index (BMI), which tells if you are at a healthy weight.  Heart rate and blood pressure.  Skin for abnormal spots. Counseling Your health care provider may ask you questions about your:  Alcohol, tobacco, and drug use.  Emotional well-being.  Home and relationship well-being.  Sexual activity.  Eating habits.  Work and work Statistician.  Method of birth control.  Menstrual cycle.  Pregnancy history. What immunizations do I need?  Influenza (flu) vaccine  This is recommended every year. Tetanus, diphtheria, and pertussis (Tdap) vaccine  You may need a Td booster every 10 years. Varicella (chickenpox) vaccine  You may need this if you have not been vaccinated. Human papillomavirus (HPV) vaccine  If recommended by your health care provider, you may need three  doses over 6 months. Measles, mumps, and rubella (MMR) vaccine  You may need at least one dose of MMR. You may also need a second dose. Meningococcal conjugate (MenACWY) vaccine  One dose is recommended if you are age 46-21 years and a first-year college student living in a residence hall, or if you have one of several medical conditions. You may also need additional booster doses. Pneumococcal conjugate (PCV13) vaccine  You may need this if you have certain conditions and were not previously vaccinated. Pneumococcal polysaccharide (PPSV23) vaccine  You may need one or two doses if you smoke cigarettes or if you have certain conditions. Hepatitis A vaccine  You may need this if you have certain conditions or if you travel or work in places where you may be exposed to hepatitis A. Hepatitis B vaccine  You may need this if you have certain conditions or if you travel or work in places where you may be exposed to hepatitis B. Haemophilus influenzae type b (Hib) vaccine  You may need this if you have certain conditions. You may receive vaccines as individual doses or as more than one vaccine together in one shot (combination vaccines). Talk with your health care provider about the risks and benefits of combination vaccines. What tests do I need?  Blood tests  Lipid and cholesterol levels. These may be checked every 5 years starting at age 12.  Hepatitis C test.  Hepatitis B test. Screening  Diabetes screening. This is done by checking your blood sugar (glucose) after you have not eaten for a while (fasting).  Sexually transmitted disease (STD) testing.  BRCA-related cancer screening. This may be done if you have a family history of breast, ovarian, tubal, or peritoneal cancers.  Pelvic exam and Pap test. This may be done every 3 years starting at age 28. Starting at age 37, this may be done every 5 years if you have a Pap test in combination with an HPV test. Talk with your  health care provider about your test results, treatment options, and if necessary, the need for more tests. Follow these instructions at home: Eating and drinking   Eat a diet that includes fresh fruits and vegetables, whole grains, lean protein, and low-fat dairy.  Take vitamin and mineral supplements as recommended by your health care provider.  Do not drink alcohol if: ? Your health care provider tells you not to drink. ? You are pregnant, may be pregnant, or are planning to become pregnant.  If you drink alcohol: ? Limit how much you have to 0-1 drink a day. ? Be aware of how much alcohol is in your drink. In the [place] one drink equals one 12 oz bottle of beer (  355 mL), one 5 oz glass of wine (148 mL), or one 1 oz glass of hard liquor (44 mL). Lifestyle  Take daily care of your teeth and gums.  Stay active. Exercise for at least 30 minutes on 5 or more days each week.  Do not use any products that contain nicotine or tobacco, such as cigarettes, e-cigarettes, and chewing tobacco. If you need help quitting, ask your health care provider.  If you are sexually active, practice safe sex. Use a condom or other form of birth control (contraception) in order to prevent pregnancy and STIs (sexually transmitted infections). If you plan to become pregnant, see your health care provider for a preconception visit. What's next?  Visit your health care provider once a year for a well check visit.  Ask your health care provider how often you should have your eyes and teeth checked.  Stay up to date on all vaccines. This information is not intended to replace advice given to you by your health care provider. Make sure you discuss any questions you have with your health care provider. Document Revised: 01/19/2018 Document Reviewed: 01/19/2018 Elsevier Patient Education  [place].  Persistent Depressive Disorder, Adult Persistent depressive disorder (PDD) is a mental health  condition that causes symptoms of low-level depression for 2 years or longer. It may also be called long-term (chronic) depression or dysthymia. PDD may include episodes of more severe depression that last for about 2 weeks (major depressive disorder or MDD). PDD can affect the way you think, feel, and sleep. This condition may also affect your relationships. You may be more likely to get sick if you have PDD. What are the causes? The exact cause of this condition is not known. PDD is most likely caused by a combination of things, which may include:  Genetic factors. These are traits that are passed along from parent to child.  Individual factors. Your personality, your behavior, and the way you handle your thoughts and feelings may contribute to PDD. This includes personality traits and behaviors learned from others.  Physical factors, such as: ? Differences in the part of your brain that controls emotion. This part of your brain may be different than it is in people who do not have PDD. ? Long-term (chronic) medical or psychiatric illnesses.  Social factors. Traumatic experiences or major life changes may play a role in the development of PDD. What increases the risk? This condition is more likely to develop in women. The following factors may make you more likely to develop PDD:  A family history of depression.  Abnormally low levels of certain brain chemicals.  Traumatic events in childhood, especially abuse or the loss of a parent.  Being under a lot of stress, or long-term stress, especially from upsetting life experiences or losses.  A history of: ? Chronic physical illness. ? Other mental health disorders. ? Substance abuse.  Poor living conditions.  Experiencing social exclusion or discrimination on a regular basis. What are the signs or symptoms? Symptoms of this condition occur for most of the day, and may include:  Fatigue or low energy.  Eating too much or too  little.  Sleeping too much or too little.  Restlessness or agitation.  Feelings of hopelessness.  Feeling worthless or guilty.  Anxiety.  Poor concentration or difficulty making decisions.  Low self-esteem.  Negative outlook.  Inability to have fun or experience pleasure.  Social withdrawal.  Unexplained physical complaints.  Irritability.  Aggressive behavior or anger. How  is this diagnosed? This condition may be diagnosed based on:  Your symptoms.  Your medical history, including your mental health history. This may involve tests to evaluate your mental health. You may be asked questions about your lifestyle, including any drug and alcohol use, and how long you have had symptoms of PDD.  A physical exam.  Blood tests to rule out other conditions. You may be diagnosed with PDD if you have had a depressed mood for 2 years or longer, as well as other symptoms of depression. How is this treated? This condition is usually treated by mental health professionals, such as psychologists, psychiatrists, and clinical social workers. You may need more than one type of treatment. Treatment may include:  Psychotherapy. This is also called talk therapy or counseling. Types of psychotherapy include: ? Cognitive behavioral therapy (CBT). This type of therapy teaches you to recognize unhealthy feelings, thoughts, and behaviors, and replace them with positive thoughts and actions. ? Interpersonal therapy (IPT). This helps you to improve the way you relate to and communicate with others. ? Family therapy. This treatment includes members of your family.  Medicine to treat anxiety and depression, or to help you control certain emotions and behaviors.  Lifestyle changes, such as: ? Limiting alcohol and drug use. ? Exercising regularly. ? Getting plenty of sleep. ? Making healthy eating choices. ? Spending more time outdoors.  Follow these instructions at home: Activity  Return to  your normal activities as told by your health care provider.  Exercise regularly and spend time outdoors as told by your health care provider. General instructions  Take over-the-counter and prescription medicines only as told by your health care provider.  Do not drink alcohol. If you drink alcohol, limit your alcohol intake to no more than 1 drink a day for nonpregnant women and 2 drinks a day for men. One drink equals 12 oz of beer, 5 oz of wine, or 1 oz of hard liquor. Alcohol can affect any antidepressant medicines you are taking. Talk to your health care provider about your alcohol use.  Eat a healthy diet and get plenty of sleep.  Find activities that you enjoy doing, and make time to do them.  Consider joining a support group. Your health care provider may be able to recommend a support group.  Keep all follow-up visits as told by your health care provider. This is important. Where to find more information [employer] on Mental Illness  www.nami.org [place] National Institute of Mental Health  https://carter.com/ National Suicide Prevention Lifeline  1-800-273-TALK (769)664-6007). This is free, 24-hour help. Contact a health care provider if:  Your symptoms get worse.  You develop new symptoms.  You have trouble sleeping or doing your daily activities. Get help right away if:  You self-harm.  You have serious thoughts about hurting yourself or others.  You see, hear, taste, smell, or feel things that are not present (hallucinate). This information is not intended to replace advice given to you by your health care provider. Make sure you discuss any questions you have with your health care provider. Document Revised: 04/22/2017 Document Reviewed: 11/22/2015 Elsevier Patient Education  [place].

## 2019-06-29 NOTE — Progress Notes (Signed)
New Patient Office Visit  Subjective:  Patient ID: Betty Davenport, female    DOB: [birth year]  Age: 24 y.o. MRN: [id number]  CC:  Chief Complaint  Patient presents with  . Establish Care    New pt, c/o sharpe pains at templel that come and go x few years.     HPI Betty Davenport presents for establishment of care and a physical exam.  She is relatively healthy as far she knows.  She is seeing her GYN provider for female care.  History of irritable bowel syndrome associated with constipation.  She uses [facility] for this as needed.  She is currently in school.  She lives with her mom and younger brother.  She works at [employer].  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  She is seeing a counselor every other week for apparent treatment of depression and sadness.   She told me that at times she has felt that it would be better if she were not here but she denies any specific plan for self-harm.   She is active through her involvement in [place].  Dental care is planned for next week.  Past Medical History:  Diagnosis Date  . Allergy   . Urinary tract infection    3 months old  . Vision abnormalities    Farsighted    No past surgical history on file.  Family History  Problem Relation Age of Onset  . Hypertension Mother   . Depression Mother   . Anxiety disorder Mother   . Cancer Mother        cervical cancer, remission  . Drug abuse Father        step father  . Alcohol abuse Father        step-father  . Sarcoidosis Maternal Grandmother   . Dementia Maternal Grandmother   . Hypothyroidism Maternal Grandmother   . Early death Maternal Grandmother        early onset dementia, Alzheimers  . Heart disease Maternal Grandmother   . Hypertension Maternal Grandmother   . Stroke Maternal Grandfather   . Arthritis Other   . Asthma Neg Hx   . Seizures Neg Hx   . Diabetes Neg Hx   . Hyperlipidemia Neg  Hx   . Birth defects Neg Hx   . COPD Neg Hx   . Hearing loss Neg Hx   . Kidney disease Neg Hx   . Learning disabilities Neg Hx   . Mental illness Neg Hx   . Mental retardation Neg Hx   . Miscarriages / Stillbirths Neg Hx   . Vision loss Neg Hx   . Varicose Veins Neg Hx     Social History   Socioeconomic History  . Marital status: Single    Spouse name: Not on file  . Number of children: Not on file  . Years of education: Not on file  . Highest education level: Not on file  Occupational History  . Not on file  Tobacco Use  . Smoking status: Never Smoker  . Smokeless tobacco: Never Used  Substance and Sexual Activity  . Alcohol use: No    Alcohol/week: 0.0 standard drinks  . Drug use: No  . Sexual activity: Yes    Birth control/protection: Pill  Other Topics Concern  . Not on file  Social History Narrative   Lives with mom and brother (12 yr)   Visits for  a few hours with father every other week   Attends [employer] is in 12th grade.   [name] [employer] on attending [employer], wants to study Geographical information systems officer    Social Determinants of Corporate investment banker Strain:   . Difficulty of Paying Living Expenses: Not on file  Food Insecurity:   . Worried About Programme researcher, broadcasting/film/video in the Last Year: Not on file  . Ran Out of Food in the Last Year: Not on file  Transportation Needs:   . Lack of Transportation (Medical): Not on file  . Lack of Transportation (Non-Medical): Not on file  Physical Activity:   . Days of Exercise per Week: Not on file  . Minutes of Exercise per Session: Not on file  Stress:   . Feeling of Stress : Not on file  Social Connections:   . Frequency of Communication with Friends and Family: Not on file  . Frequency of Social Gatherings with Friends and Family: Not on file  . Attends Religious Services: Not on file  . Active Member of Clubs or Organizations: Not on file  . Attends Banker Meetings: Not on file  .  Marital Status: Not on file  Intimate Partner Violence:   . Fear of Current or Ex-Partner: Not on file  . Emotionally Abused: Not on file  . Physically Abused: Not on file  . Sexually Abused: Not on file    ROS Review of Systems  Constitutional: Negative.   HENT: Negative.   Eyes: Negative for photophobia and visual disturbance.  Respiratory: Negative.   Cardiovascular: Negative.   Gastrointestinal: Negative.   Endocrine: Negative for polyphagia and polyuria.  Genitourinary: Negative.   Musculoskeletal: Negative for gait problem and joint swelling.  Skin: Negative for pallor and rash.  Allergic/Immunologic: Negative for immunocompromised state.  Neurological: Negative for tremors and speech difficulty.  Hematological: Does not bruise/bleed easily.  Psychiatric/Behavioral: Positive for dysphoric mood. The patient is nervous/anxious.    Depression screen [facility] 2/9 06/29/2019 06/29/2019 02/11/2015  Decreased Interest 1 0 1  Down, Depressed, Hopeless 1 0 1  PHQ - 2 Score 2 0 2  Altered sleeping 2 - 1  Tired, decreased energy 2 - 1  Change in appetite 1 - 1  Feeling bad or failure about yourself  1 - 3  Trouble concentrating 1 - 1  Moving slowly or fidgety/restless 1 - 1  Suicidal thoughts 1 - 2  PHQ-9 Score 11 - 12  Difficult doing work/chores Somewhat difficult - -    Objective:   Today's Vitals: BP 108/62   Pulse 92   Temp (!) 96.2 F (35.7 C) (Tympanic)   Ht 5\' 1"  (1.549 m)   Wt 113 lb 6.4 oz (51.4 kg)   SpO2 99%   BMI 21.43 kg/m   Physical Exam Constitutional:      General: She is not in acute distress.    Appearance: Normal appearance. She is normal weight. She is not ill-appearing, toxic-appearing or diaphoretic.  HENT:     Head: Normocephalic and atraumatic.     Right Ear: Tympanic membrane, ear canal and external ear normal. There is no impacted cerumen.     Left Ear: Tympanic membrane, ear canal and external ear normal. There is no impacted cerumen.     Nose:  No congestion or rhinorrhea.     Mouth/Throat:     Mouth: Mucous membranes are dry.     Pharynx: Oropharynx is clear.  No oropharyngeal exudate or posterior oropharyngeal erythema.  Eyes:     Extraocular Movements: Extraocular movements intact.     Conjunctiva/sclera: Conjunctivae normal.     Pupils: Pupils are equal, round, and reactive to light.  Cardiovascular:     Rate and Rhythm: Normal rate and regular rhythm.     Pulses: Normal pulses.     Heart sounds: Normal heart sounds.  Pulmonary:     Effort: Pulmonary effort is normal.     Breath sounds: Normal breath sounds.  Abdominal:     General: Bowel sounds are normal.  Musculoskeletal:     Cervical back: No rigidity or tenderness.     Right lower leg: No edema.     Left lower leg: No edema.  Lymphadenopathy:     Cervical: No cervical adenopathy.  Neurological:     Mental Status: She is alert and oriented to person, place, and time.  Psychiatric:        Mood and Affect: Mood normal.        Behavior: Behavior normal.     Assessment & Plan:   Problem List Items Addressed This Visit    None    Visit Diagnoses    Healthcare maintenance    -  Primary   Relevant Orders   CBC   Comprehensive metabolic panel   Lipid panel   TSH   Urinalysis, Routine w reflex microscopic      Outpatient Encounter Medications as of 06/29/2019  Medication Sig  . cetirizine (ZYRTEC) 10 MG tablet TAKE 1 TABLET BY MOUTH EVERY DAY  . Cholecalciferol (VITAMIN D PO) Take by mouth.  . fluticasone (FLONASE) 50 MCG/ACT nasal spray Place 2 sprays into both nostrils daily.  . norethindrone-ethinyl estradiol-iron (JUNEL FE 1.5/30) 1.5-30 MG-MCG tablet Take 1 tablet by mouth daily.  . norethindrone-ethinyl estradiol-iron (JUNEL FE 1.5/30) 1.5-30 MG-MCG tablet Take 1 tablet by mouth daily.  Dellia Nims CALCIUM + D3 500-200 MG-UNIT TABS TAKE 1 TABLET BY MOUTH DAILY WITH BREAKFAST.  Marland Kitchen polyethylene glycol powder (GLYCOLAX/MIRALAX) powder Take 255 g by mouth  daily.  . TRULANCE 3 MG TABS Take 1 tablet by mouth daily.  Marland Kitchen amoxicillin-clavulanate (AUGMENTIN) 875-125 MG tablet Take 1 tablet by mouth every 12 (twelve) hours. (Patient not taking: Reported on 06/29/2019)  . ipratropium (ATROVENT) 0.06 % nasal spray Place 2 sprays into both nostrils 4 (four) times daily. (Patient not taking: Reported on 06/29/2019)   No facility-administered encounter medications on file as of 06/29/2019.    Follow-up: Return in about 6 months (around 12/27/2019), or if symptoms worsen or fail to improve, for Continue seeing your therapist. Please return if you might want to try medical therapy. . I had suggested that medical therapy may be of benefit for her.  She will discuss it with her counselor.  She told me that at times she has felt that it would be better if she were not here but she denies any specific plan for self-harm.  She will return fasting for blood work.  Libby Maw, MD

## 2019-07-03 ENCOUNTER — Other Ambulatory Visit: Payer: Self-pay

## 2019-07-04 ENCOUNTER — Encounter: Payer: Self-pay | Admitting: Family Medicine

## 2019-07-04 ENCOUNTER — Other Ambulatory Visit (INDEPENDENT_AMBULATORY_CARE_PROVIDER_SITE_OTHER): Payer: 59

## 2019-07-04 DIAGNOSIS — Z0000 Encounter for general adult medical examination without abnormal findings: Secondary | ICD-10-CM | POA: Diagnosis not present

## 2019-07-04 LAB — CBC
HCT: 32.1 % — ABNORMAL LOW (ref 36.0–46.0)
Hemoglobin: 10.6 g/dL — ABNORMAL LOW (ref 12.0–15.0)
MCHC: 33 g/dL (ref 30.0–36.0)
MCV: 82 fl (ref 78.0–100.0)
Platelets: 251 10*3/uL (ref 150.0–400.0)
RBC: 3.91 Mil/uL (ref 3.87–5.11)
RDW: 13.8 % (ref 11.5–15.5)
WBC: 6.6 10*3/uL (ref 4.0–10.5)

## 2019-07-04 LAB — COMPREHENSIVE METABOLIC PANEL
ALT: 10 U/L (ref 0–35)
AST: 14 U/L (ref 0–37)
Albumin: 4.1 g/dL (ref 3.5–5.2)
Alkaline Phosphatase: 24 U/L — ABNORMAL LOW (ref 39–117)
BUN: 11 mg/dL (ref 6–23)
CO2: 27 mEq/L (ref 19–32)
Calcium: 8.6 mg/dL (ref 8.4–10.5)
Chloride: 106 mEq/L (ref 96–112)
Creatinine, Ser: 0.77 mg/dL (ref 0.40–1.20)
GFR: 113.13 mL/min (ref >60.00)
Glucose, Bld: 78 mg/dL (ref 70–99)
Potassium: 3.8 mEq/L (ref 3.5–5.1)
Sodium: 139 mEq/L (ref 135–145)
Total Bilirubin: 0.4 mg/dL (ref 0.2–1.2)
Total Protein: 6.7 g/dL (ref 6.0–8.3)

## 2019-07-04 LAB — URINALYSIS, ROUTINE W REFLEX MICROSCOPIC
Bilirubin Urine: NEGATIVE
Leukocytes,Ua: NEGATIVE
Nitrite: NEGATIVE
Specific Gravity, Urine: >=1.03 — AB (ref 1.000–1.030)
Total Protein, Urine: NEGATIVE
Urine Glucose: NEGATIVE
Urobilinogen, UA: 1 (ref 0.0–1.0)
pH: 6 (ref 5.0–8.0)

## 2019-07-04 LAB — LIPID PANEL
Cholesterol: 142 mg/dL (ref 0–200)
HDL: 48.1 mg/dL (ref >39.00)
LDL Cholesterol: 82 mg/dL (ref 0–99)
NonHDL: 93.56
Total CHOL/HDL Ratio: 3
Triglycerides: 58 mg/dL (ref 0.0–149.0)
VLDL: 11.6 mg/dL (ref 0.0–40.0)

## 2019-07-04 LAB — TSH: TSH: 3.84 u[IU]/mL (ref 0.35–4.50)

## 2019-08-22 ENCOUNTER — Encounter: Payer: Self-pay | Admitting: Emergency Medicine

## 2019-08-22 ENCOUNTER — Ambulatory Visit
Admission: EM | Admit: 2019-08-22 | Discharge: 2019-08-22 | Disposition: A | Discharge status: Home / Self Care | Payer: 59 | Attending: Emergency Medicine | Admitting: Emergency Medicine

## 2019-08-22 ENCOUNTER — Other Ambulatory Visit: Payer: Self-pay

## 2019-08-22 DIAGNOSIS — Z20822 Contact with and (suspected) exposure to covid-19: Secondary | ICD-10-CM

## 2019-08-22 DIAGNOSIS — J302 Other seasonal allergic rhinitis: Secondary | ICD-10-CM

## 2019-08-22 NOTE — ED Provider Notes (Signed)
[facility]    CSN: [id number] Arrival date & time: 08/22/19  1640      History   Chief Complaint Chief Complaint  Patient presents with  . URI    HPI Betty Davenport is a 24 y.o. female with history of allergies presenting for Covid testing.  States that she missed work Monday due to feeling woozy, inability to fall asleep, fatigue, decreased appetite, and tickle in her throat.  Patient also notes coughing spell that night that was dry and without shortness of breath or chest pain.  Patient has not had coughing fit since.  Patient denying fever, chills, nausea/vomiting, abdominal pain, diarrhea.  Patient also attends school in person: No known sick contacts.  Wheezes of both mother and coworkers are concerned due to "new strain of Covid".    Past Medical History:  Diagnosis Date  . Allergy   . Urinary tract infection    3 months old  . Vision abnormalities    Farsighted    Patient Active Problem List   Diagnosis Date Noted  . Childhood asthma without complication 12/03/2015  . Vitamin D insufficiency 06/12/2014  . Low serum alkaline phosphatase 02/25/2014  . Endocrine function study abnormality 02/25/2014  . Short stature 02/25/2014  . Allergic rhinitis 04/18/2013  . Family problems 01/20/2011  . Adjustment disorder with depressed mood 01/20/2011    History reviewed. No pertinent surgical history.  OB History   No obstetric history on file.      Home Medications    Prior to Admission medications   Medication Sig Start Date End Date Taking? Authorizing Provider  cetirizine (ZYRTEC) 10 MG tablet TAKE 1 TABLET BY MOUTH EVERY DAY 08/22/17  Yes Klett, Pascal Lux, NP  fluticasone (FLONASE) 50 MCG/ACT nasal spray Place 2 sprays into both nostrils daily. 04/29/18  Yes Yu, Amy V, PA-C  Cholecalciferol (VITAMIN D PO) Take by mouth.    [provider]  ipratropium (ATROVENT) 0.06 % nasal spray Place 2 sprays into both nostrils 4 (four) times daily. Patient  not taking: Reported on 06/29/2019 04/29/18   Belinda Fisher, PA-C  norethindrone-ethinyl estradiol-iron (JUNEL FE 1.5/30) 1.5-30 MG-MCG tablet Take 1 tablet by mouth daily. 08/21/15   Myra Rude, MD  norethindrone-ethinyl estradiol-iron (JUNEL FE 1.5/30) 1.5-30 MG-MCG tablet Take 1 tablet by mouth daily. 07/19/17   Hollice Gong, MD  OS-CAL CALCIUM + D3 500-200 MG-UNIT TABS TAKE 1 TABLET BY MOUTH DAILY WITH BREAKFAST. 06/16/16   Klett, Pascal Lux, NP  polyethylene glycol powder (GLYCOLAX/MIRALAX) powder Take 255 g by mouth daily. 07/19/17   Hollice Gong, MD  TRULANCE 3 MG TABS Take 1 tablet by mouth daily. 04/12/19   [provider]    Family History Family History  Problem Relation Age of Onset  . Hypertension Mother   . Depression Mother   . Anxiety disorder Mother   . Cancer Mother        cervical cancer, remission  . Drug abuse Father        step father  . Alcohol abuse Father        step-father  . Sarcoidosis Maternal Grandmother   . Dementia Maternal Grandmother   . Hypothyroidism Maternal Grandmother   . Early death Maternal Grandmother        early onset dementia, Alzheimers  . Heart disease Maternal Grandmother   . Hypertension Maternal Grandmother   . Stroke Maternal Grandfather   . Arthritis Other   . Asthma Neg Hx   .  Seizures Neg Hx   . Diabetes Neg Hx   . Hyperlipidemia Neg Hx   . Birth defects Neg Hx   . COPD Neg Hx   . Hearing loss Neg Hx   . Kidney disease Neg Hx   . Learning disabilities Neg Hx   . Mental illness Neg Hx   . Mental retardation Neg Hx   . Miscarriages / Stillbirths Neg Hx   . Vision loss Neg Hx   . Varicose Veins Neg Hx     Social History Social History   Tobacco Use  . Smoking status: Never Smoker  . Smokeless tobacco: Never Used  Substance Use Topics  . Alcohol use: No    Alcohol/week: 0.0 standard drinks  . Drug use: No     Allergies   Patient has no known allergies.   Review of Systems As per HPI   Physical  Exam Triage Vital Signs ED Triage Vitals  Enc Vitals Group     BP      Pulse      Resp      Temp      Temp src      SpO2      Weight      Height      Head Circumference      Peak Flow      Pain Score      Pain Loc      Pain Edu?      Excl. in [place]?    No data found.  Updated Vital Signs BP 126/79 (BP Location: Left Arm)   Pulse 78   Temp 97.8 F (36.6 C) (Temporal)   Resp 16   LMP 08/07/2019   SpO2 98%   Visual Acuity Right Eye Distance:   Left Eye Distance:   Bilateral Distance:    Right Eye Near:   Left Eye Near:    Bilateral Near:     Physical Exam Constitutional:      General: She is not in acute distress.    Appearance: She is not ill-appearing.  HENT:     Head: Normocephalic and atraumatic.     Right Ear: Tympanic membrane, ear canal and external ear normal.     Left Ear: Tympanic membrane, ear canal and external ear normal.     Nose: Nose normal.     Mouth/Throat:     Mouth: Mucous membranes are moist.     Pharynx: Oropharynx is clear.  Eyes:     General: No scleral icterus.    Conjunctiva/sclera: Conjunctivae normal.     Pupils: Pupils are equal, round, and reactive to light.  Cardiovascular:     Rate and Rhythm: Normal rate and regular rhythm.  Pulmonary:     Effort: Pulmonary effort is normal. No respiratory distress.     Breath sounds: No wheezing.  Musculoskeletal:     Cervical back: No tenderness.  Lymphadenopathy:     Cervical: No cervical adenopathy.  Skin:    Coloration: Skin is not jaundiced or pale.  Neurological:     Mental Status: She is alert and oriented to person, place, and time.      UC Treatments / Results  Labs (all labs ordered are listed, but only abnormal results are displayed) Labs Reviewed - No data to display  EKG   Radiology No results found.  Procedures Procedures (including critical care time)  Medications Ordered in UC Medications - No data to display  Initial Impression / Assessment and Plan /  UC Course  I have reviewed the triage vital signs and the nursing notes.  Pertinent labs & imaging results that were available during my care of the patient were reviewed by me and considered in my medical decision making (see chart for details).     Patient afebrile, nontoxic, with SpO2 98%.  Covid PCR pending.  Patient to quarantine until results are back.  We will continue supportive management.  Return precautions discussed, patient verbalized understanding and is agreeable to plan. Final Clinical Impressions(s) / UC Diagnoses   Final diagnoses:  Encounter for laboratory testing for COVID-19 virus  Seasonal allergies     Discharge Instructions     Your COVID test is pending - it is important to quarantine / isolate at home until your results are back. If you test positive and would like further evaluation for persistent or worsening symptoms, you may schedule an E-visit or virtual (video) visit throughout the [facility] app or website.  PLEASE NOTE: If you develop severe chest pain or shortness of breath please go to the ER or call [phone] for further evaluation --> DO NOT schedule electronic or virtual visits for this. Please call our office for further guidance / recommendations as needed.  For information about the Covid vaccine, please visit [email or web address]    ED Prescriptions    None     PDMP not reviewed this encounter.   Hall[place]Potvin, [place], [place] 08/22/19 1709

## 2019-08-22 NOTE — ED Notes (Signed)
Patient able to ambulate independently  

## 2019-08-22 NOTE — Discharge Instructions (Addendum)
Your COVID test is pending - it is important to quarantine / isolate at home until your results are back. °If you test positive and would like further evaluation for persistent or worsening symptoms, you may schedule an E-visit or virtual (video) visit throughout the [facility] MyChart app or website. ° °PLEASE NOTE: If you develop severe chest pain or shortness of breath please go to the ER or call 9-1-1 for further evaluation --> DO NOT schedule electronic or virtual visits for this. °Please call our office for further guidance / recommendations as needed. ° °For information about the Covid vaccine, please visit [email or web address] °

## 2019-08-22 NOTE — ED Triage Notes (Signed)
Pt presents to [facility] for a "not that bad" tickle in her throat since last weekend.  Patient states on Monday she was feeling woozy, was unable to sleep, and felt fatigued.  Patient states she came home from work last night and took a Benadryl to help her sleep.  States she had one episode of coughing that lasted a few minutes.  Denies cough otherwise.  States after speakingt o her mother about her symptoms, she was encouraged to seek out medical care.

## 2019-08-23 LAB — SARS-COV-2, NAA 2 DAY TAT

## 2019-08-23 LAB — NOVEL CORONAVIRUS, NAA: SARS-CoV-2, NAA: NOT DETECTED

## 2019-08-25 ENCOUNTER — Ambulatory Visit: Payer: 59 | Attending: Internal Medicine

## 2019-08-25 DIAGNOSIS — Z23 Encounter for immunization: Secondary | ICD-10-CM

## 2019-08-25 NOTE — Progress Notes (Signed)
   Covid-19 Vaccination Clinic  Name:  Betty Davenport    MRN: [id number] DOB: 1995-07-02  08/25/2019  [name] was observed post Covid-19 immunization for 15 minutes without incident. She was provided with Vaccine Information Sheet and instruction to access the V-Safe system.   [name] was instructed to call 911 with any severe reactions post vaccine: Marland Kitchen Difficulty breathing  . Swelling of face and throat  . A fast heartbeat  . A bad rash all over body  . Dizziness and weakness   Immunizations Administered    Name Date Dose VIS Date Route   Moderna COVID-19 Vaccine 08/25/2019 11:14 AM 0.5 mL 04/24/2019 Intramuscular   Manufacturer: Moderna   Lot: [id number]   NDC: 83462-194-71

## 2019-09-22 ENCOUNTER — Ambulatory Visit: Payer: 59 | Attending: Internal Medicine

## 2019-09-22 DIAGNOSIS — Z23 Encounter for immunization: Secondary | ICD-10-CM

## 2019-09-22 NOTE — Progress Notes (Signed)
   Covid-19 Vaccination Clinic  Name:  Betty Davenport    MRN: [id number] DOB: 01-Sep-1995  09/22/2019  Ms. Hipolito was observed post Covid-19 immunization for 15 minutes without incident. She was provided with Vaccine Information Sheet and instruction to access the V-Safe system.   Ms. Eye was instructed to call 911 with any severe reactions post vaccine: Marland Kitchen Difficulty breathing  . Swelling of face and throat  . A fast heartbeat  . A bad rash all over body  . Dizziness and weakness   Immunizations Administered    Name Date Dose VIS Date Route   Moderna COVID-19 Vaccine 09/22/2019 10:24 AM 0.5 mL 04/2019 Intramuscular   Manufacturer: Moderna   Lot: [id number]   NDC: 15183-437-35

## 2019-12-17 DIAGNOSIS — H5213 Myopia, bilateral: Secondary | ICD-10-CM | POA: Diagnosis not present

## 2019-12-27 ENCOUNTER — Ambulatory Visit: Payer: No Typology Code available for payment source | Admitting: Family Medicine

## 2019-12-31 ENCOUNTER — Other Ambulatory Visit: Payer: Self-pay

## 2020-01-01 ENCOUNTER — Encounter: Payer: Self-pay | Admitting: Family Medicine

## 2020-01-01 ENCOUNTER — Ambulatory Visit (INDEPENDENT_AMBULATORY_CARE_PROVIDER_SITE_OTHER): Payer: No Typology Code available for payment source | Admitting: Family Medicine

## 2020-01-01 VITALS — BP 114/68 | HR 100 | Temp 97.4°F | Ht 61.0 in | Wt 107.4 lb

## 2020-01-01 DIAGNOSIS — D649 Anemia, unspecified: Secondary | ICD-10-CM

## 2020-01-01 DIAGNOSIS — F341 Dysthymic disorder: Secondary | ICD-10-CM | POA: Diagnosis not present

## 2020-01-01 DIAGNOSIS — R69 Illness, unspecified: Secondary | ICD-10-CM | POA: Diagnosis not present

## 2020-01-01 LAB — CBC
HCT: 36 % (ref 36.0–46.0)
Hemoglobin: 11.8 g/dL — ABNORMAL LOW (ref 12.0–15.0)
MCHC: 32.9 g/dL (ref 30.0–36.0)
MCV: 81.6 fl (ref 78.0–100.0)
Platelets: 205 10*3/uL (ref 150.0–400.0)
RBC: 4.41 Mil/uL (ref 3.87–5.11)
RDW: 14.4 % (ref 11.5–15.5)
WBC: 5 10*3/uL (ref 4.0–10.5)

## 2020-01-01 NOTE — Progress Notes (Signed)
Established Patient Office Visit  Subjective:  Patient ID: Betty Davenport, female    DOB: May 29, 1995  Age: 24 y.o. MRN: [id number]  CC:  Chief Complaint  Patient presents with   Follow-up    6 month follow up, no concerns.    HPI Betty Davenport presents for follow-up of anemia dysthymia.  Patient has been taking a multivitamin with iron.  Continues with counseling every other week.  Patient and her counselor have discussed treating her depression with medication but both want to hold off for now.  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  Past Medical History:  Diagnosis Date   Allergy    Urinary tract infection    3 months old   Vision abnormalities    Farsighted    No past surgical history on file.  Family History  Problem Relation Age of Onset   Hypertension Mother    Depression Mother    Anxiety disorder Mother    Cancer Mother        cervical cancer, remission   Drug abuse Father        step father   Alcohol abuse Father        step-father   Sarcoidosis Maternal Grandmother    Dementia Maternal Grandmother    Hypothyroidism Maternal Grandmother    Early death Maternal Grandmother        early onset dementia, Alzheimers   Heart disease Maternal Grandmother    Hypertension Maternal Grandmother    Stroke Maternal Grandfather    Arthritis Other    Asthma Neg Hx    Seizures Neg Hx    Diabetes Neg Hx    Hyperlipidemia Neg Hx    Birth defects Neg Hx    COPD Neg Hx    Hearing loss Neg Hx    Kidney disease Neg Hx    Learning disabilities Neg Hx    Mental illness Neg Hx    Mental retardation Neg Hx    Miscarriages / Stillbirths Neg Hx    Vision loss Neg Hx    Varicose Veins Neg Hx     Social History   Socioeconomic History   Marital status: Single    Spouse name: Not on file   Number of children: Not on file   Years of education: Not on file   Highest education level:  Not on file  Occupational History   Not on file  Tobacco Use   Smoking status: Never Smoker   Smokeless tobacco: Never Used  Vaping Use   Vaping Use: Never used  Substance and Sexual Activity   Alcohol use: No    Alcohol/week: 0.0 standard drinks   Drug use: No   Sexual activity: Yes    Birth control/protection: Pill  Other Topics Concern   Not on file  Social History Narrative   Lives with mom and brother (12 yr)   Visits for a few hours with father every other week   Attends [employer] is in 12th grade.   [name] [employer] on attending [employer], wants to study Geographical information systems officer    Social Determinants of Corporate investment banker Strain:    Difficulty of Paying Living Expenses:   Food Insecurity:    Worried About Programme researcher, broadcasting/film/video in the Last Year:    Barista in the Last Year:   Transportation Needs:    Freight forwarder (Medical):  Lack of Transportation (Non-Medical):   Physical Activity:    Days of Exercise per Week:    Minutes of Exercise per Session:   Stress:    Feeling of Stress :   Social Connections:    Frequency of Communication with Friends and Family:    Frequency of Social Gatherings with Friends and Family:    Attends Religious Services:    Active Member of Clubs or Organizations:    Attends Engineer, structural:    Marital Status:   Intimate Partner Violence:    Fear of Current or Ex-Partner:    Emotionally Abused:    Physically Abused:    Sexually Abused:     Outpatient Medications Prior to Visit  Medication Sig Dispense Refill   cetirizine (ZYRTEC) 10 MG tablet TAKE 1 TABLET BY MOUTH EVERY DAY 30 tablet 1   Cholecalciferol (VITAMIN D PO) Take by mouth.     fluticasone (FLONASE) 50 MCG/ACT nasal spray Place 2 sprays into both nostrils daily. 1 g 0   norethindrone-ethinyl estradiol-iron (JUNEL FE 1.5/30) 1.5-30 MG-MCG tablet Take 1 tablet by mouth daily. 28 tablet 11    norethindrone-ethinyl estradiol-iron (JUNEL FE 1.5/30) 1.5-30 MG-MCG tablet Take 1 tablet by mouth daily. 28 tablet 11   OS-CAL CALCIUM + D3 500-200 MG-UNIT TABS TAKE 1 TABLET BY MOUTH DAILY WITH BREAKFAST. 30 tablet 3   polyethylene glycol powder (GLYCOLAX/MIRALAX) powder Take 255 g by mouth daily. 255 g 5   TRULANCE 3 MG TABS Take 1 tablet by mouth daily.     ipratropium (ATROVENT) 0.06 % nasal spray Place 2 sprays into both nostrils 4 (four) times daily. (Patient not taking: Reported on 06/29/2019) 15 mL 0   No facility-administered medications prior to visit.    No Known Allergies  ROS Review of Systems  Constitutional: Negative for diaphoresis, fatigue, fever and unexpected weight change.  Respiratory: Negative.   Cardiovascular: Negative.   Gastrointestinal: Negative.   Neurological: Negative for speech difficulty and weakness.  Hematological: Does not bruise/bleed easily.   Depression screen [facility] 2/9 06/29/2019 06/29/2019 02/11/2015  Decreased Interest 1 0 1  Down, Depressed, Hopeless 1 0 1  PHQ - 2 Score 2 0 2  Altered sleeping 2 - 1  Tired, decreased energy 2 - 1  Change in appetite 1 - 1  Feeling bad or failure about yourself  1 - 3  Trouble concentrating 1 - 1  Moving slowly or fidgety/restless 1 - 1  Suicidal thoughts 1 - 2  PHQ-9 Score 11 - 12  Difficult doing work/chores Somewhat difficult - -      Objective:    Physical Exam Vitals and nursing note reviewed.  Constitutional:      General: She is not in acute distress.    Appearance: Normal appearance. She is normal weight. She is not ill-appearing, toxic-appearing or diaphoretic.  HENT:     Head: Normocephalic and atraumatic.     Right Ear: External ear normal.     Left Ear: External ear normal.  Eyes:     General: No scleral icterus.       Right eye: No discharge.        Left eye: No discharge.     Conjunctiva/sclera: Conjunctivae normal.  Pulmonary:     Effort: Pulmonary effort is normal.  Skin:     General: Skin is warm and dry.  Neurological:     Mental Status: She is alert and oriented to person, place, and time.  Psychiatric:  Mood and Affect: Mood normal.        Behavior: Behavior normal.     BP 114/68    Pulse 100    Temp (!) 97.4 F (36.3 C) (Tympanic)    Ht 5\' 1"  (1.549 m)    Wt 107 lb 6.4 oz (48.7 kg)    SpO2 97%    BMI 20.29 kg/m  Wt Readings from Last 3 Encounters:  01/01/20 107 lb 6.4 oz (48.7 kg)  06/29/19 113 lb 6.4 oz (51.4 kg)  07/19/17 114 lb (51.7 kg)     Health Maintenance Due  Topic Date Due   Hepatitis C Screening  Never done   PAP-Cervical Cytology Screening  Never done   PAP SMEAR-Modifier  Never done   INFLUENZA VACCINE  12/23/2019    There are no preventive care reminders to display for this patient.  Lab Results  Component Value Date   TSH 3.84 07/04/2019   Lab Results  Component Value Date   WBC 6.6 07/04/2019   HGB 10.6 (L) 07/04/2019   HCT 32.1 (L) 07/04/2019   MCV 82.0 07/04/2019   PLT 251.0 07/04/2019   Lab Results  Component Value Date   NA 139 07/04/2019   K 3.8 07/04/2019   CO2 27 07/04/2019   GLUCOSE 78 07/04/2019   BUN 11 07/04/2019   CREATININE 0.77 07/04/2019   BILITOT 0.4 07/04/2019   ALKPHOS 24 (L) 07/04/2019   AST 14 07/04/2019   ALT 10 07/04/2019   PROT 6.7 07/04/2019   ALBUMIN 4.1 07/04/2019   CALCIUM 8.6 07/04/2019   GFR 113.13 07/04/2019   Lab Results  Component Value Date   CHOL 142 07/04/2019   Lab Results  Component Value Date   HDL 48.10 07/04/2019   Lab Results  Component Value Date   LDLCALC 82 07/04/2019   Lab Results  Component Value Date   TRIG 58.0 07/04/2019   Lab Results  Component Value Date   CHOLHDL 3 07/04/2019   No results found for: HGBA1C    Assessment & Plan:   Problem List Items Addressed This Visit      Other   Dysthymia   Anemia - Primary   Relevant Orders   CBC   Iron, TIBC and Ferritin Panel      No orders of the defined types were placed  in this encounter.   Follow-up: Return in about 6 months (around 07/03/2020).   We will continue with counseling.  Not interested medications or sadness at this point.  Continue multivitamin with iron for now pending results of CBC.  Follow-up in 6 months.  Again patient denies any intention of self-harm. 08/31/2020, MD

## 2020-01-02 LAB — IRON,TIBC AND FERRITIN PANEL
%SAT: 21 % (calc) (ref 16–45)
Ferritin: 18 ng/mL (ref 16–154)
Iron: 88 ug/dL (ref 40–190)
TIBC: 424 mcg/dL (calc) (ref 250–450)

## 2020-01-14 DIAGNOSIS — Z6820 Body mass index (BMI) 20.0-20.9, adult: Secondary | ICD-10-CM | POA: Diagnosis not present

## 2020-01-14 DIAGNOSIS — Z3041 Encounter for surveillance of contraceptive pills: Secondary | ICD-10-CM | POA: Diagnosis not present

## 2020-01-14 DIAGNOSIS — Z01419 Encounter for gynecological examination (general) (routine) without abnormal findings: Secondary | ICD-10-CM | POA: Diagnosis not present

## 2020-04-30 DIAGNOSIS — G4709 Other insomnia: Secondary | ICD-10-CM | POA: Diagnosis not present

## 2020-04-30 DIAGNOSIS — K5900 Constipation, unspecified: Secondary | ICD-10-CM | POA: Diagnosis not present

## 2020-05-12 DIAGNOSIS — G4459 Other complicated headache syndrome: Secondary | ICD-10-CM | POA: Diagnosis not present

## 2020-05-12 DIAGNOSIS — Z23 Encounter for immunization: Secondary | ICD-10-CM | POA: Diagnosis not present

## 2020-05-28 DIAGNOSIS — G4709 Other insomnia: Secondary | ICD-10-CM | POA: Diagnosis not present

## 2020-07-03 ENCOUNTER — Ambulatory Visit: Payer: No Typology Code available for payment source | Admitting: Family Medicine

## 2020-07-16 ENCOUNTER — Other Ambulatory Visit: Payer: Self-pay

## 2020-07-16 ENCOUNTER — Ambulatory Visit
Admission: EM | Admit: 2020-07-16 | Discharge: 2020-07-16 | Disposition: A | Discharge status: Home / Self Care | Payer: No Typology Code available for payment source | Attending: Emergency Medicine | Admitting: Emergency Medicine

## 2020-07-16 DIAGNOSIS — J069 Acute upper respiratory infection, unspecified: Secondary | ICD-10-CM | POA: Diagnosis not present

## 2020-07-16 DIAGNOSIS — J029 Acute pharyngitis, unspecified: Secondary | ICD-10-CM

## 2020-07-16 LAB — POCT RAPID STREP A (OFFICE): Rapid Strep A Screen: NEGATIVE

## 2020-07-16 MED ORDER — FLUTICASONE PROPIONATE 50 MCG/ACT NA SUSP: 1.0000 | Freq: Every day | NASAL | 0 refills | Status: DC

## 2020-07-16 MED ORDER — CETIRIZINE HCL 10 MG PO CAPS: 10.0000 mg | ORAL_CAPSULE | Freq: Every day | ORAL | 0 refills | Status: DC

## 2020-07-16 MED ORDER — IBUPROFEN 800 MG PO TABS: 800.0000 mg | ORAL_TABLET | Freq: Three times a day (TID) | ORAL | 0 refills | Status: DC

## 2020-07-16 NOTE — Discharge Instructions (Addendum)
Sore Throat  Your rapid strep tested Negative today.  Sore throat likely viral or related to postnasal drainage  Please continue Tylenol or Ibuprofen for fever and pain. May try salt water gargles, cepacol lozenges, throat spray, or OTC cold relief medicine for throat discomfort. If you also have congestion take a daily anti-histamine like Zyrtec, Claritin, and a oral decongestant to help with post nasal drip that may be irritating your throat.   Stay hydrated and drink plenty of fluids to keep your throat coated relieve irritation.

## 2020-07-16 NOTE — ED Triage Notes (Signed)
Pt c/o sore throat x2 days, neck pain since yesterday.

## 2020-07-17 NOTE — ED Provider Notes (Signed)
[facility]    CSN: [id number] Arrival date & time: 07/16/20  1859      History   Chief Complaint Chief Complaint  Patient presents with  . Sore Throat  . Neck Pain    HPI Betty Davenport is a 25 y.o. female presenting today for evaluation of sore throat.  Reports sore throat over the past 2 days.  Reports associated neck discomfort and some mild congestion beginning today.  Denies any cough.  Denies fevers chills or body aches.  Denies any known sick exposures.  HPI  Past Medical History:  Diagnosis Date  . Allergy   . Urinary tract infection    3 months old  . Vision abnormalities    Farsighted    Patient Active Problem List   Diagnosis Date Noted  . Dysthymia 01/01/2020  . Anemia 01/01/2020  . Childhood asthma without complication 12/03/2015  . Vitamin D insufficiency 06/12/2014  . Low serum alkaline phosphatase 02/25/2014  . Endocrine function study abnormality 02/25/2014  . Short stature 02/25/2014  . Allergic rhinitis 04/18/2013  . Family problems 01/20/2011  . Adjustment disorder with depressed mood 01/20/2011    History reviewed. No pertinent surgical history.  OB History   No obstetric history on file.      Home Medications    Prior to Admission medications   Medication Sig Start Date End Date Taking? Authorizing Provider  Cetirizine HCl 10 MG CAPS Take 1 capsule (10 mg total) by mouth daily for 10 days. 07/16/20 07/26/20 Yes Melaney Tellefsen C, PA-C  fluticasone (FLONASE) 50 MCG/ACT nasal spray Place 1-2 sprays into both nostrils daily. 07/16/20  Yes Areyanna Figeroa C, PA-C  ibuprofen (ADVIL) 800 MG tablet Take 1 tablet (800 mg total) by mouth 3 (three) times daily. 07/16/20  Yes Kishaun Erekson C, PA-C  Cholecalciferol (VITAMIN D PO) Take by mouth.    [provider]  norethindrone-ethinyl estradiol-iron (JUNEL FE 1.5/30) 1.5-30 MG-MCG tablet Take 1 tablet by mouth daily. 08/21/15   Myra Rude, MD  norethindrone-ethinyl  estradiol-iron (JUNEL FE 1.5/30) 1.5-30 MG-MCG tablet Take 1 tablet by mouth daily. 07/19/17   Hollice Gong, MD  OS-CAL CALCIUM + D3 500-200 MG-UNIT TABS TAKE 1 TABLET BY MOUTH DAILY WITH BREAKFAST. 06/16/16   Klett, Pascal Lux, NP  polyethylene glycol powder (GLYCOLAX/MIRALAX) powder Take 255 g by mouth daily. 07/19/17   Hollice Gong, MD  TRULANCE 3 MG TABS Take 1 tablet by mouth daily. 04/12/19   [provider]    Family History Family History  Problem Relation Age of Onset  . Hypertension Mother   . Depression Mother   . Anxiety disorder Mother   . Cancer Mother        cervical cancer, remission  . Drug abuse Father        step father  . Alcohol abuse Father        step-father  . Sarcoidosis Maternal Grandmother   . Dementia Maternal Grandmother   . Hypothyroidism Maternal Grandmother   . Early death Maternal Grandmother        early onset dementia, Alzheimers  . Heart disease Maternal Grandmother   . Hypertension Maternal Grandmother   . Stroke Maternal Grandfather   . Arthritis Other   . Asthma Neg Hx   . Seizures Neg Hx   . Diabetes Neg Hx   . Hyperlipidemia Neg Hx   . Birth defects Neg Hx   . COPD Neg Hx   . Hearing loss Neg Hx   .  Kidney disease Neg Hx   . Learning disabilities Neg Hx   . Mental illness Neg Hx   . Mental retardation Neg Hx   . Miscarriages / Stillbirths Neg Hx   . Vision loss Neg Hx   . Varicose Veins Neg Hx     Social History Social History   Tobacco Use  . Smoking status: Never Smoker  . Smokeless tobacco: Never Used  Vaping Use  . Vaping Use: Never used  Substance Use Topics  . Alcohol use: No    Alcohol/week: 0.0 standard drinks  . Drug use: No     Allergies   Patient has no known allergies.   Review of Systems Review of Systems  Constitutional: Negative for activity change, appetite change, chills, fatigue and fever.  HENT: Positive for congestion, rhinorrhea and sore throat. Negative for ear pain, sinus  pressure and trouble swallowing.   Eyes: Negative for discharge and redness.  Respiratory: Negative for cough, chest tightness and shortness of breath.   Cardiovascular: Negative for chest pain.  Gastrointestinal: Negative for abdominal pain, diarrhea, nausea and vomiting.  Musculoskeletal: Negative for myalgias.  Skin: Negative for rash.  Neurological: Negative for dizziness, light-headedness and headaches.     Physical Exam Triage Vital Signs ED Triage Vitals [07/16/20 2007]  Enc Vitals Group     BP (!) 114/59     Pulse Rate 65     Resp 18     Temp 98.3 F (36.8 C)     Temp Source Oral     SpO2 98 %     Weight      Height      Head Circumference      Peak Flow      Pain Score 6     Pain Loc      Pain Edu?      Excl. in GC?    No data found.  Updated Vital Signs BP (!) 114/59 (BP Location: Left Arm)   Pulse 65   Temp 98.3 F (36.8 C) (Oral)   Resp 18   LMP 06/13/2020   SpO2 98%   Visual Acuity Right Eye Distance:   Left Eye Distance:   Bilateral Distance:    Right Eye Near:   Left Eye Near:    Bilateral Near:     Physical Exam Vitals and nursing note reviewed.  Constitutional:      Appearance: She is well-developed and well-nourished.     Comments: No acute distress  HENT:     Head: Normocephalic and atraumatic.     Ears:     Comments: Bilateral ears without tenderness to palpation of external auricle, tragus and mastoid, EAC's without erythema or swelling, TM's with good bony landmarks and cone of light. Non erythematous.     Nose: Nose normal.     Mouth/Throat:     Comments: Oral mucosa pink and moist, no tonsillar enlargement or exudate. Posterior pharynx patent and nonerythematous, no uvula deviation or swelling. Normal phonation. Eyes:     Conjunctiva/sclera: Conjunctivae normal.  Neck:     Comments: Full active range of motion of neck, no lymphadenopathy, no nuchal rigidity Cardiovascular:     Rate and Rhythm: Normal rate and regular rhythm.   Pulmonary:     Effort: Pulmonary effort is normal. No respiratory distress.     Comments: Breathing comfortably at rest, CTABL, no wheezing, rales or other adventitious sounds auscultated Abdominal:     General: There is no distension.  Musculoskeletal:  General: Normal range of motion.     Cervical back: Neck supple.  Skin:    General: Skin is warm and dry.  Neurological:     Mental Status: She is alert and oriented to person, place, and time.  Psychiatric:        Mood and Affect: Mood and affect normal.      UC Treatments / Results  Labs (all labs ordered are listed, but only abnormal results are displayed) Labs Reviewed  CULTURE, GROUP A STREP [facility])  POCT RAPID STREP A (OFFICE)    EKG   Radiology No results found.  Procedures Procedures (including critical care time)  Medications Ordered in UC Medications - No data to display  Initial Impression / Assessment and Plan / UC Course  I have reviewed the triage vital signs and the nursing notes.  Pertinent labs & imaging results that were available during my care of the patient were reviewed by me and considered in my medical decision making (see chart for details).     Strep negative, reports prior Covid negative, will defer further testing.  Recommend symptomatic and supportive care of sore throat and URI symptoms, suspect viral etiology related to drainage.  Discussed strict return precautions. Patient verbalized understanding and is agreeable with plan.  Final Clinical Impressions(s) / UC Diagnoses   Final diagnoses:  Viral URI  Sore throat     Discharge Instructions     Sore Throat  Your rapid strep tested Negative today.  Sore throat likely viral or related to postnasal drainage  Please continue Tylenol or Ibuprofen for fever and pain. May try salt water gargles, cepacol lozenges, throat spray, or OTC cold relief medicine for throat discomfort. If you also have congestion take a daily  anti-histamine like Zyrtec, Claritin, and a oral decongestant to help with post nasal drip that may be irritating your throat.   Stay hydrated and drink plenty of fluids to keep your throat coated relieve irritation.     ED Prescriptions    Medication Sig Dispense Auth. Provider   Cetirizine HCl 10 MG CAPS Take 1 capsule (10 mg total) by mouth daily for 10 days. 10 capsule Kleber Crean C, PA-C   ibuprofen (ADVIL) 800 MG tablet Take 1 tablet (800 mg total) by mouth 3 (three) times daily. 21 tablet Kalman Nylen C, PA-C   fluticasone (FLONASE) 50 MCG/ACT nasal spray Place 1-2 sprays into both nostrils daily. 16 g Creola Krotz, [place] C, PA-C     PDMP not reviewed this encounter.   Lew Dawes, [place] 07/17/20 [phone]

## 2020-07-20 LAB — CULTURE, GROUP A STREP (THRC)

## 2020-08-01 ENCOUNTER — Ambulatory Visit
Admission: EM | Admit: 2020-08-01 | Discharge: 2020-08-01 | Disposition: A | Discharge status: Home / Self Care | Payer: No Typology Code available for payment source

## 2020-08-01 ENCOUNTER — Other Ambulatory Visit: Payer: Self-pay

## 2020-08-01 ENCOUNTER — Encounter: Payer: Self-pay | Admitting: Emergency Medicine

## 2020-08-01 DIAGNOSIS — R0981 Nasal congestion: Secondary | ICD-10-CM

## 2020-08-01 NOTE — ED Triage Notes (Signed)
Pt here for nasal congestion x 2 weeks; pt sts needs note for work

## 2020-08-01 NOTE — ED Provider Notes (Signed)
[facility]    CSN: [id number] Arrival date & time: 08/01/20  1539      History   Chief Complaint Chief Complaint  Patient presents with  . Nasal Congestion    HPI Betty Davenport is a 25 y.o. female.   HPI Patient presents today for return to work note.  Patient had brought in [facility] paperwork work was advised that we are unable to complete that type of paperwork.  She has been out of work due to ongoing nasal congestion.  She has not tested positive for Covid.  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  Patient is using Flonase and taking cetirizine.  She has not developed any new symptoms on the side of the nasal congestion. Past Medical History:  Diagnosis Date  . Allergy   . Urinary tract infection    3 months old  . Vision abnormalities    Farsighted    Patient Active Problem List   Diagnosis Date Noted  . Dysthymia 01/01/2020  . Anemia 01/01/2020  . Childhood asthma without complication 12/03/2015  . Vitamin D insufficiency 06/12/2014  . Low serum alkaline phosphatase 02/25/2014  . Endocrine function study abnormality 02/25/2014  . Short stature 02/25/2014  . Allergic rhinitis 04/18/2013  . Family problems 01/20/2011  . Adjustment disorder with depressed mood 01/20/2011    History reviewed. No pertinent surgical history.  OB History   No obstetric history on file.      Home Medications    Prior to Admission medications   Medication Sig Start Date End Date Taking? Authorizing Provider  Cetirizine HCl 10 MG CAPS Take 1 capsule (10 mg total) by mouth daily for 10 days. 07/16/20 07/26/20  Wieters, Hallie C, PA-C  Cholecalciferol (VITAMIN D PO) Take by mouth.    [provider]  fluticasone (FLONASE) 50 MCG/ACT nasal spray Place 1-2 sprays into both nostrils daily. 07/16/20   Wieters, Hallie C, PA-C  ibuprofen (ADVIL) 800 MG tablet Take 1 tablet (800 mg total) by mouth 3 (three)  times daily. 07/16/20   Wieters, Hallie C, PA-C  norethindrone-ethinyl estradiol-iron (JUNEL FE 1.5/30) 1.5-30 MG-MCG tablet Take 1 tablet by mouth daily. 08/21/15   Myra Rude, MD  norethindrone-ethinyl estradiol-iron (JUNEL FE 1.5/30) 1.5-30 MG-MCG tablet Take 1 tablet by mouth daily. 07/19/17   Hollice Gong, MD  OS-CAL CALCIUM + D3 500-200 MG-UNIT TABS TAKE 1 TABLET BY MOUTH DAILY WITH BREAKFAST. 06/16/16   Klett, Pascal Lux, NP  polyethylene glycol powder (GLYCOLAX/MIRALAX) powder Take 255 g by mouth daily. 07/19/17   Hollice Gong, MD  TRULANCE 3 MG TABS Take 1 tablet by mouth daily. 04/12/19   [provider]    Family History Family History  Problem Relation Age of Onset  . Hypertension Mother   . Depression Mother   . Anxiety disorder Mother   . Cancer Mother        cervical cancer, remission  . Drug abuse Father        step father  . Alcohol abuse Father        step-father  . Sarcoidosis Maternal Grandmother   . Dementia Maternal Grandmother   . Hypothyroidism Maternal Grandmother   . Early death Maternal Grandmother        early onset dementia, Alzheimers  . Heart disease Maternal Grandmother   . Hypertension Maternal Grandmother   . Stroke Maternal Grandfather   . Arthritis Other   .  Asthma Neg Hx   . Seizures Neg Hx   . Diabetes Neg Hx   . Hyperlipidemia Neg Hx   . Birth defects Neg Hx   . COPD Neg Hx   . Hearing loss Neg Hx   . Kidney disease Neg Hx   . Learning disabilities Neg Hx   . Mental illness Neg Hx   . Mental retardation Neg Hx   . Miscarriages / Stillbirths Neg Hx   . Vision loss Neg Hx   . Varicose Veins Neg Hx     Social History Social History   Tobacco Use  . Smoking status: Never Smoker  . Smokeless tobacco: Never Used  Vaping Use  . Vaping Use: Never used  Substance Use Topics  . Alcohol use: No    Alcohol/week: 0.0 standard drinks  . Drug use: No     Allergies   Patient has no known allergies.   Review of  Systems Review of Systems Pertinent negatives listed in HPI   Physical Exam Triage Vital Signs ED Triage Vitals  Enc Vitals Group     BP 08/01/20 1640 111/68     Pulse Rate 08/01/20 1640 87     Resp 08/01/20 1640 20     Temp 08/01/20 1640 99 F (37.2 C)     Temp Source 08/01/20 1640 Oral     SpO2 08/01/20 1640 97 %     Weight --      Height --      Head Circumference --      Peak Flow --      Pain Score 08/01/20 1645 0     Pain Loc --      Pain Edu? --      Excl. in GC? --    No data found.  Updated Vital Signs BP 111/68 (BP Location: Left Arm)   Pulse 87   Temp 99 F (37.2 C) (Oral)   Resp 20   SpO2 97%   Visual Acuity Right Eye Distance:   Left Eye Distance:   Bilateral Distance:    Right Eye Near:   Left Eye Near:    Bilateral Near:     Physical Exam  General Appearance:    Alert, cooperative, no distress  HENT:   Normocephalic, ears normal, nares pale with mild congestion,   rhinorrhea, oropharynx    Eyes:    PERRL, conjunctiva/corneas clear, EOM's intact       Lungs:     Clear to auscultation bilaterally, respirations unlabored  Heart:    Regular rate and rhythm  Neurologic:   Awake, alert, oriented x 3. No apparent focal neurological           defect.      UC Treatments / Results  Labs (all labs ordered are listed, but only abnormal results are displayed) Labs Reviewed - No data to display  EKG   Radiology No results found.  Procedures Procedures (including critical care time)  Medications Ordered in UC Medications - No data to display  Initial Impression / Assessment and Plan / UC Course  I have reviewed the triage vital signs and the nursing notes.  Pertinent labs & imaging results that were available during my care of the patient were reviewed by me and considered in my medical decision making (see chart for details).    Nasal congestion likely related to seasonal allergies continue Flonase and cetirizine.  Note provided and  advised him patient may return to work tonight without restrictions.  Final Clinical Impressions(s) / UC Diagnoses   Final diagnoses:  Nasal congestion     Discharge Instructions     Continue Flonase and cetirizine for nasal symptoms.    ED Prescriptions    None     PDMP not reviewed this encounter.   Bing Neighbors, FNP 08/01/20 1727

## 2020-08-01 NOTE — Discharge Instructions (Signed)
Continue Flonase and cetirizine for nasal symptoms.

## 2020-08-04 ENCOUNTER — Ambulatory Visit: Payer: No Typology Code available for payment source | Admitting: Family Medicine

## 2020-08-10 ENCOUNTER — Encounter: Payer: Self-pay | Admitting: Family Medicine

## 2020-08-21 ENCOUNTER — Other Ambulatory Visit: Payer: Self-pay

## 2020-08-21 ENCOUNTER — Other Ambulatory Visit (INDEPENDENT_AMBULATORY_CARE_PROVIDER_SITE_OTHER): Payer: No Typology Code available for payment source

## 2020-08-21 ENCOUNTER — Telehealth (INDEPENDENT_AMBULATORY_CARE_PROVIDER_SITE_OTHER): Payer: No Typology Code available for payment source | Admitting: Family Medicine

## 2020-08-21 ENCOUNTER — Encounter: Payer: Self-pay | Admitting: Family Medicine

## 2020-08-21 DIAGNOSIS — D649 Anemia, unspecified: Secondary | ICD-10-CM

## 2020-08-21 DIAGNOSIS — J069 Acute upper respiratory infection, unspecified: Secondary | ICD-10-CM | POA: Insufficient documentation

## 2020-08-21 LAB — CBC
HCT: 33.6 % — ABNORMAL LOW (ref 36.0–46.0)
Hemoglobin: 11 g/dL — ABNORMAL LOW (ref 12.0–15.0)
MCHC: 32.7 g/dL (ref 30.0–36.0)
MCV: 80.3 fl (ref 78.0–100.0)
Platelets: 218 10*3/uL (ref 150.0–400.0)
RBC: 4.19 Mil/uL (ref 3.87–5.11)
RDW: 14.9 % (ref 11.5–15.5)
WBC: 3.6 10*3/uL — ABNORMAL LOW (ref 4.0–10.5)

## 2020-08-21 NOTE — Progress Notes (Addendum)
Established Patient Office Visit  Subjective:  Patient ID: Betty Davenport, female    DOB: [birth year]  Age: 25 y.o. MRN: [id number]  CC:  Chief Complaint  Patient presents with  . paperwork     Pt wants to discuss paperwork     HPI Brentley Horrell presents for fu from URI symptoms from 2/21 to include nasal congestion, pnd, decreased appetitie, fatigue, without fever. Subjective elevation in temp. Seen at Urgent care back on 3/11. Negative Covid test times 2. Has since recovered. Returned to work on 3/13. Symptoms have resolved.  Needs FMLA paperwork to cover her from 221 through 311.  She tells me that she was unable to take a multivitamin with iron due to severe constipation.  Past Medical History:  Diagnosis Date  . Allergy   . Urinary tract infection    3 months old  . Vision abnormalities    Farsighted    History reviewed. No pertinent surgical history.  Family History  Problem Relation Age of Onset  . Hypertension Mother   . Depression Mother   . Anxiety disorder Mother   . Cancer Mother        cervical cancer, remission  . Drug abuse Father        step father  . Alcohol abuse Father        step-father  . Sarcoidosis Maternal Grandmother   . Dementia Maternal Grandmother   . Hypothyroidism Maternal Grandmother   . Early death Maternal Grandmother        early onset dementia, Alzheimers  . Heart disease Maternal Grandmother   . Hypertension Maternal Grandmother   . Stroke Maternal Grandfather   . Arthritis Other   . Asthma Neg Hx   . Seizures Neg Hx   . Diabetes Neg Hx   . Hyperlipidemia Neg Hx   . Birth defects Neg Hx   . COPD Neg Hx   . Hearing loss Neg Hx   . Kidney disease Neg Hx   . Learning disabilities Neg Hx   . Mental illness Neg Hx   . Mental retardation Neg Hx   . Miscarriages / Stillbirths Neg Hx   . Vision loss Neg Hx   . Varicose Veins Neg Hx     Social History   Socioeconomic History  . Marital status: Single    Spouse name: Not on file   . Number of children: Not on file  . Years of education: Not on file  . Highest education level: Not on file  Occupational History  . Not on file  Tobacco Use  . Smoking status: Never Smoker  . Smokeless tobacco: Never Used  Vaping Use  . Vaping Use: Never used  Substance and Sexual Activity  . Alcohol use: No    Alcohol/week: 0.0 standard drinks  . Drug use: No  . Sexual activity: Yes    Birth control/protection: Pill  Other Topics Concern  . Not on file  Social History Narrative   Lives with mom and brother (12 yr)   Visits for a few hours with father every other week   Attends [employer] is in 12th grade.   [name] [employer] on attending [employer], wants to study Geographical information systems officer    Social Determinants of Corporate investment banker Strain: Not on [employer] Insecurity: Not on file  Transportation Needs: Not on file  Physical Activity: Not on file  Stress: Not on file  Social Connections: Not on  file  Intimate Partner Violence: Not on file    Outpatient Medications Prior to Visit  Medication Sig Dispense Refill  . Cholecalciferol (VITAMIN D PO) Take by mouth.    . fluticasone (FLONASE) 50 MCG/ACT nasal spray Place 1-2 sprays into both nostrils daily. 16 g 0  . norethindrone-ethinyl estradiol-iron (JUNEL FE 1.5/30) 1.5-30 MG-MCG tablet Take 1 tablet by mouth daily. 28 tablet 11  . OS-CAL CALCIUM + D3 500-200 MG-UNIT TABS TAKE 1 TABLET BY MOUTH DAILY WITH BREAKFAST. 30 tablet 3  . polyethylene glycol powder (GLYCOLAX/MIRALAX) powder Take 255 g by mouth daily. 255 g 5  . Vitamin D, Ergocalciferol, (DRISDOL) 1.25 MG (50000 UNIT) CAPS capsule Take 50,000 Units by mouth once a week.    . cetirizine (ZYRTEC) 10 MG tablet Take 10 mg by mouth daily.    . Cetirizine HCl 10 MG CAPS Take 1 capsule (10 mg total) by mouth daily for 10 days. 10 capsule 0  . ibuprofen (ADVIL) 800 MG tablet Take 1 tablet (800 mg total) by mouth 3 (three) times daily. (Patient not  taking: Reported on 08/21/2020) 21 tablet 0  . norethindrone-ethinyl estradiol-iron (JUNEL FE 1.5/30) 1.5-30 MG-MCG tablet Take 1 tablet by mouth daily. (Patient not taking: Reported on 08/21/2020) 28 tablet 11  . TRULANCE 3 MG TABS Take 1 tablet by mouth daily. (Patient not taking: Reported on 08/21/2020)     No facility-administered medications prior to visit.    No Known Allergies  ROS Review of Systems  Constitutional: Negative for diaphoresis, fatigue, fever and unexpected weight change.  HENT: Negative.   Eyes: Negative for photophobia and visual disturbance.  Respiratory: Negative.   Cardiovascular: Negative.   Gastrointestinal: Negative.   Endocrine: Negative for polyphagia and polyuria.  Genitourinary: Negative.   Musculoskeletal: Negative.   Psychiatric/Behavioral: Negative.       Objective:    Physical Exam Vitals and nursing note reviewed.  Constitutional:      General: She is not in acute distress. Pulmonary:     Effort: Pulmonary effort is normal.  Neurological:     Mental Status: She is oriented to person, place, and time.  Psychiatric:        Mood and Affect: Mood normal.        Behavior: Behavior normal.     There were no vitals taken for this visit. Wt Readings from Last 3 Encounters:  01/01/20 107 lb 6.4 oz (48.7 kg)  06/29/19 113 lb 6.4 oz (51.4 kg)  07/19/17 114 lb (51.7 kg)     Health Maintenance Due  Topic Date Due  . Hepatitis C Screening  Never done  . PAP-Cervical Cytology Screening  Never done  . PAP SMEAR-Modifier  Never done    There are no preventive care reminders to display for this patient.  Lab Results  Component Value Date   TSH 3.84 07/04/2019   Lab Results  Component Value Date   WBC 3.6 (L) 08/21/2020   HGB 11.0 (L) 08/21/2020   HCT 33.6 (L) 08/21/2020   MCV 80.3 08/21/2020   PLT 218.0 08/21/2020   Lab Results  Component Value Date   NA 139 07/04/2019   K 3.8 07/04/2019   CO2 27 07/04/2019   GLUCOSE 78  07/04/2019   BUN 11 07/04/2019   CREATININE 0.77 07/04/2019   BILITOT 0.4 07/04/2019   ALKPHOS 24 (L) 07/04/2019   AST 14 07/04/2019   ALT 10 07/04/2019   PROT 6.7 07/04/2019   ALBUMIN 4.1 07/04/2019   CALCIUM  8.6 07/04/2019   GFR 113.13 07/04/2019   Lab Results  Component Value Date   CHOL 142 07/04/2019   Lab Results  Component Value Date   HDL 48.10 07/04/2019   Lab Results  Component Value Date   LDLCALC 82 07/04/2019   Lab Results  Component Value Date   TRIG 58.0 07/04/2019   Lab Results  Component Value Date   CHOLHDL 3 07/04/2019   No results found for: HGBA1C    Assessment & Plan:   Problem List Items Addressed This Visit      Other   Anemia - Primary   Relevant Medications   ferrous sulfate 300 (60 Fe) MG/5ML syrup   Other Relevant Orders   CBC (Completed)   Iron, TIBC and Ferritin Panel (Completed)   Recent URI      Meds ordered this encounter  Medications  . ferrous sulfate 300 (60 Fe) MG/5ML syrup    Sig: Mix 2 tsp with 1/5 cup of orange juice daily    Dispense:  150 mL    Refill:  3    Follow-up: Return if symptoms worsen or fail to improve.  Will fill out paperwork for FMLA.  She will have her blood work rechecked today when she dropped off her paperwork.  Mliss Sax, MD   Virtual Visit via Telephone Note  I connected with Rich Brave on 08/24/20 at 11:00 AM EDT by telephone and verified that I am speaking with the correct person using two identifiers.  Location: Patient: home Provider:    I discussed the limitations, risks, security and privacy concerns of performing an evaluation and management service by telephone and the availability of in person appointments. I also discussed with the patient that there may be a patient responsible charge related to this service. The patient expressed understanding and agreed to proceed.   History of Present Illness:    Observations/Objective:   Assessment and  Plan:   Follow Up Instructions:    I discussed the assessment and treatment plan with the patient. The patient was provided an opportunity to ask questions and all were answered. The patient agreed with the plan and demonstrated an understanding of the instructions.   The patient was advised to call back or seek an in-person evaluation if the symptoms worsen or if the condition fails to improve as anticipated.  I provided 20 minutes of non-face-to-face time during this encounter.   Mliss Sax, MD

## 2020-08-22 LAB — IRON,TIBC AND FERRITIN PANEL
%SAT: 24 % (calc) (ref 16–45)
Ferritin: 18 ng/mL (ref 16–154)
Iron: 102 ug/dL (ref 40–190)
TIBC: 418 mcg/dL (calc) (ref 250–450)

## 2020-08-24 MED ORDER — FERROUS SULFATE 300 (60 FE) MG/5ML PO SYRP: ORAL_SOLUTION | ORAL | 3 refills | Status: DC

## 2020-08-24 NOTE — Addendum Note (Signed)
Addended by: Andrez Grime on: 08/24/2020 07:15 AM   Modules accepted: Orders

## 2020-08-29 DIAGNOSIS — Z0279 Encounter for issue of other medical certificate: Secondary | ICD-10-CM

## 2020-09-01 ENCOUNTER — Other Ambulatory Visit: Payer: Self-pay

## 2020-09-02 ENCOUNTER — Encounter: Payer: Self-pay | Admitting: Family Medicine

## 2020-09-02 ENCOUNTER — Ambulatory Visit (INDEPENDENT_AMBULATORY_CARE_PROVIDER_SITE_OTHER): Payer: No Typology Code available for payment source | Admitting: Family Medicine

## 2020-09-02 VITALS — BP 115/68 | HR 76 | Temp 98.0°F | Ht 61.0 in | Wt 111.6 lb

## 2020-09-02 DIAGNOSIS — D649 Anemia, unspecified: Secondary | ICD-10-CM | POA: Diagnosis not present

## 2020-09-02 DIAGNOSIS — J301 Allergic rhinitis due to pollen: Secondary | ICD-10-CM

## 2020-09-02 MED ORDER — CETIRIZINE HCL 10 MG PO CAPS: ORAL_CAPSULE | ORAL | 1 refills | Status: DC

## 2020-09-02 MED ORDER — FLUTICASONE PROPIONATE 50 MCG/ACT NA SUSP: 1.0000 | Freq: Every day | NASAL | 6 refills | Status: AC

## 2020-09-02 NOTE — Progress Notes (Signed)
Established Patient Office Visit  Subjective:  Patient ID: Betty Davenport, female    DOB: Dec 21, 1995  Age: 25 y.o. MRN: [id number]  CC:  Chief Complaint  Patient presents with  . Follow-up    6 month follow up visit. No concerns.     HPI Nur Rabold presents for follow-up of nasal congestion, postnasal drip, sneezing and coughing.  She denies fevers chills headache watery eyes difficulty breathing or purulent phlegm production.  Was unable to take liquid iron.  She has not taking a multivitamin at this time.  Menstrual flow and cramping is largely controlled with OCPs.  Currently studying Psychologist, counselling.  Past Medical History:  Diagnosis Date  . Allergy   . Urinary tract infection    3 months old  . Vision abnormalities    Farsighted    History reviewed. No pertinent surgical history.  Family History  Problem Relation Age of Onset  . Hypertension Mother   . Depression Mother   . Anxiety disorder Mother   . Cancer Mother        cervical cancer, remission  . Drug abuse Father        step father  . Alcohol abuse Father        step-father  . Sarcoidosis Maternal Grandmother   . Dementia Maternal Grandmother   . Hypothyroidism Maternal Grandmother   . Early death Maternal Grandmother        early onset dementia, Alzheimers  . Heart disease Maternal Grandmother   . Hypertension Maternal Grandmother   . Stroke Maternal Grandfather   . Arthritis Other   . Asthma Neg Hx   . Seizures Neg Hx   . Diabetes Neg Hx   . Hyperlipidemia Neg Hx   . Birth defects Neg Hx   . COPD Neg Hx   . Hearing loss Neg Hx   . Kidney disease Neg Hx   . Learning disabilities Neg Hx   . Mental illness Neg Hx   . Mental retardation Neg Hx   . Miscarriages / Stillbirths Neg Hx   . Vision loss Neg Hx   . Varicose Veins Neg Hx     Social History   Socioeconomic History  . Marital status: Single    Spouse name: Not on file  . Number of children: Not on file  . Years of  education: Not on file  . Highest education level: Not on file  Occupational History  . Not on file  Tobacco Use  . Smoking status: Never Smoker  . Smokeless tobacco: Never Used  Vaping Use  . Vaping Use: Never used  Substance and Sexual Activity  . Alcohol use: No    Alcohol/week: 0.0 standard drinks  . Drug use: No  . Sexual activity: Yes    Birth control/protection: Pill  Other Topics Concern  . Not on file  Social History Narrative   Lives with mom and brother (12 yr)   Visits for a few hours with father every other week   Attends [employer] is in 12th grade.   [name] [employer] on attending [employer], wants to study Geographical information systems officer    Social Determinants of Corporate investment banker Strain: Not on [employer] Insecurity: Not on file  Transportation Needs: Not on file  Physical Activity: Not on file  Stress: Not on file  Social Connections: Not on file  Intimate Partner Violence: Not on file    Outpatient Medications Prior to Visit  Medication Sig Dispense Refill  . Cholecalciferol (VITAMIN D PO) Take by mouth.    . norethindrone-ethinyl estradiol-iron (JUNEL FE 1.5/30) 1.5-30 MG-MCG tablet Take 1 tablet by mouth daily. 28 tablet 11  . polyethylene glycol powder (GLYCOLAX/MIRALAX) powder Take 255 g by mouth daily. 255 g 5  . Vitamin D, Ergocalciferol, (DRISDOL) 1.25 MG (50000 UNIT) CAPS capsule Take 50,000 Units by mouth once a week.    . cetirizine (ZYRTEC) 10 MG tablet Take 10 mg by mouth daily.    . fluticasone (FLONASE) 50 MCG/ACT nasal spray Place 1-2 sprays into both nostrils daily. 16 g 0  . Cetirizine HCl 10 MG CAPS Take 1 capsule (10 mg total) by mouth daily for 10 days. 10 capsule 0  . ferrous sulfate 300 (60 Fe) MG/5ML syrup Mix 2 tsp with 1/5 cup of orange juice daily (Patient not taking: Reported on 09/02/2020) 150 mL 3  . ibuprofen (ADVIL) 800 MG tablet Take 1 tablet (800 mg total) by mouth 3 (three) times daily. (Patient not taking: No  sig reported) 21 tablet 0  . norethindrone-ethinyl estradiol-iron (JUNEL FE 1.5/30) 1.5-30 MG-MCG tablet Take 1 tablet by mouth daily. (Patient not taking: Reported on 08/21/2020) 28 tablet 11  . OS-CAL CALCIUM + D3 500-200 MG-UNIT TABS TAKE 1 TABLET BY MOUTH DAILY WITH BREAKFAST. 30 tablet 3  . TRULANCE 3 MG TABS Take 1 tablet by mouth daily. (Patient not taking: No sig reported)     No facility-administered medications prior to visit.    No Known Allergies  ROS Review of Systems  Constitutional: Negative for diaphoresis, fatigue, fever and unexpected weight change.  HENT: Positive for congestion, postnasal drip, rhinorrhea and sneezing.   Eyes: Negative for photophobia and visual disturbance.  Respiratory: Positive for cough. Negative for shortness of breath and wheezing.   Cardiovascular: Negative.   Gastrointestinal: Negative.   Genitourinary: Negative.   Skin: Negative for pallor and rash.  Neurological: Negative for weakness.  Psychiatric/Behavioral: Negative.       Objective:    Physical Exam Vitals and nursing note reviewed.  Constitutional:      Appearance: Normal appearance.  HENT:     Head: Normocephalic and atraumatic.     Right Ear: Tympanic membrane, ear canal and external ear normal.     Left Ear: Tympanic membrane, ear canal and external ear normal.     Mouth/Throat:     Mouth: Mucous membranes are moist.     Pharynx: Oropharynx is clear. No oropharyngeal exudate or posterior oropharyngeal erythema.  Eyes:     General: No scleral icterus.       Right eye: No discharge.        Left eye: No discharge.     Conjunctiva/sclera: Conjunctivae normal.     Pupils: Pupils are equal, round, and reactive to light.  Cardiovascular:     Rate and Rhythm: Normal rate and regular rhythm.  Pulmonary:     Effort: Pulmonary effort is normal.     Breath sounds: Normal breath sounds.  Abdominal:     General: Bowel sounds are normal.  Musculoskeletal:     Cervical back: No  rigidity or tenderness.  Lymphadenopathy:     Cervical: No cervical adenopathy.  Skin:    General: Skin is warm and dry.  Neurological:     Mental Status: She is alert and oriented to person, place, and time.  Psychiatric:        Mood and Affect: Mood normal.  Behavior: Behavior normal.     BP 115/68   Pulse 76   Temp 98 F (36.7 C) (Temporal)   Ht 5\' 1"  (1.549 m)   Wt 111 lb 9.6 oz (50.6 kg)   SpO2 98%   BMI 21.09 kg/m  Wt Readings from Last 3 Encounters:  09/02/20 111 lb 9.6 oz (50.6 kg)  01/01/20 107 lb 6.4 oz (48.7 kg)  06/29/19 113 lb 6.4 oz (51.4 kg)     Health Maintenance Due  Topic Date Due  . Hepatitis C Screening  Never done  . PAP-Cervical Cytology Screening  Never done  . PAP SMEAR-Modifier  Never done    There are no preventive care reminders to display for this patient.  Lab Results  Component Value Date   TSH 3.84 07/04/2019   Lab Results  Component Value Date   WBC 3.6 (L) 08/21/2020   HGB 11.0 (L) 08/21/2020   HCT 33.6 (L) 08/21/2020   MCV 80.3 08/21/2020   PLT 218.0 08/21/2020   Lab Results  Component Value Date   NA 139 07/04/2019   K 3.8 07/04/2019   CO2 27 07/04/2019   GLUCOSE 78 07/04/2019   BUN 11 07/04/2019   CREATININE 0.77 07/04/2019   BILITOT 0.4 07/04/2019   ALKPHOS 24 (L) 07/04/2019   AST 14 07/04/2019   ALT 10 07/04/2019   PROT 6.7 07/04/2019   ALBUMIN 4.1 07/04/2019   CALCIUM 8.6 07/04/2019   GFR 113.13 07/04/2019   Lab Results  Component Value Date   CHOL 142 07/04/2019   Lab Results  Component Value Date   HDL 48.10 07/04/2019   Lab Results  Component Value Date   LDLCALC 82 07/04/2019   Lab Results  Component Value Date   TRIG 58.0 07/04/2019   Lab Results  Component Value Date   CHOLHDL 3 07/04/2019   No results found for: HGBA1C    Assessment & Plan:   Problem List Items Addressed This Visit      Respiratory   Allergic rhinitis   Relevant Medications   Cetirizine HCl 10 MG CAPS    fluticasone (FLONASE) 50 MCG/ACT nasal spray     Other   Anemia - Primary   Relevant Orders   CBC   Iron, TIBC and Ferritin Panel   Hgb Fractionation Cascade      Meds ordered this encounter  Medications  . Cetirizine HCl 10 MG CAPS    Sig: May take one daily as needed for allergy signs and symptoms.    Dispense:  90 capsule    Refill:  1  . fluticasone (FLONASE) 50 MCG/ACT nasal spray    Sig: Place 1-2 sprays into both nostrils daily.    Dispense:  16 g    Refill:  6    Follow-up: Return if symptoms worsen or fail to improve.  Advised patient to try continual use with Flonase.  She may need to take the cetirizine at night.  She will try a multivitamin with iron.  No family history of anemia but will check for thalassemia.  09/01/2019, MD

## 2020-09-03 LAB — IRON,TIBC AND FERRITIN PANEL
%SAT: 10 % (calc) — ABNORMAL LOW (ref 16–45)
Ferritin: 17 ng/mL (ref 16–154)
Iron: 48 ug/dL (ref 40–190)
TIBC: 491 mcg/dL (calc) — ABNORMAL HIGH (ref 250–450)

## 2020-09-03 LAB — CBC
HCT: 36.4 % (ref 36.0–46.0)
Hemoglobin: 11.9 g/dL — ABNORMAL LOW (ref 12.0–15.0)
MCHC: 32.6 g/dL (ref 30.0–36.0)
MCV: 81.4 fl (ref 78.0–100.0)
Platelets: 279 10*3/uL (ref 150.0–400.0)
RBC: 4.47 Mil/uL (ref 3.87–5.11)
RDW: 15.1 % (ref 11.5–15.5)
WBC: 4 10*3/uL (ref 4.0–10.5)

## 2020-09-04 LAB — HGB FRACTIONATION CASCADE
Hgb A2: 2.6 % (ref 1.8–3.2)
Hgb A: 97.4 % (ref 96.4–98.8)
Hgb F: 0 % (ref 0.0–2.0)
Hgb S: 0 %

## 2020-10-28 DIAGNOSIS — Z0000 Encounter for general adult medical examination without abnormal findings: Secondary | ICD-10-CM | POA: Diagnosis not present

## 2020-10-28 DIAGNOSIS — Z7289 Other problems related to lifestyle: Secondary | ICD-10-CM | POA: Diagnosis not present

## 2020-10-28 DIAGNOSIS — R519 Headache, unspecified: Secondary | ICD-10-CM | POA: Diagnosis not present

## 2020-10-28 DIAGNOSIS — R7302 Impaired glucose tolerance (oral): Secondary | ICD-10-CM | POA: Diagnosis not present

## 2020-10-28 DIAGNOSIS — Z13220 Encounter for screening for lipoid disorders: Secondary | ICD-10-CM | POA: Diagnosis not present

## 2020-10-28 DIAGNOSIS — Z136 Encounter for screening for cardiovascular disorders: Secondary | ICD-10-CM | POA: Diagnosis not present

## 2021-05-29 ENCOUNTER — Telehealth: Payer: No Typology Code available for payment source | Admitting: Emergency Medicine

## 2021-05-29 DIAGNOSIS — U071 COVID-19: Secondary | ICD-10-CM

## 2021-05-29 MED ORDER — MOLNUPIRAVIR EUA 200MG CAPSULE: 4.0000 | ORAL_CAPSULE | Freq: Two times a day (BID) | ORAL | 0 refills | Status: AC

## 2021-05-29 NOTE — Progress Notes (Signed)
Virtual Visit Consent   Betty Davenport, you are scheduled for a virtual visit with a [facility] provider today.     Just as with appointments in the office, your consent must be obtained to participate.  Your consent will be active for this visit and any virtual visit you may have with one of our providers in the next 365 days.     If you have a MyChart account, a copy of this consent can be sent to you electronically.  All virtual visits are billed to your insurance company just like a traditional visit in the office.    As this is a virtual visit, video technology does not allow for your provider to perform a traditional examination.  This may limit your provider's ability to fully assess your condition.  If your provider identifies any concerns that need to be evaluated in person or the need to arrange testing (such as labs, EKG, etc.), we will make arrangements to do so.     Although advances in technology are sophisticated, we cannot ensure that it will always work on either your end or our end.  If the connection with a video visit is poor, the visit may have to be switched to a telephone visit.  With either a video or telephone visit, we are not always able to ensure that we have a secure connection.     I need to obtain your verbal consent now.   Are you willing to proceed with your visit today?    Betty Davenport has provided verbal consent on 05/29/2021 for a virtual visit video.   Montine Circle, PA-C   Date: 05/29/2021 2:13 PM   Virtual Visit via Video Note   I, Montine Circle, connected with  Betty Davenport  ([id number], [birth year]) on 05/29/21 at  2:15 PM EST by a video-enabled telemedicine application and verified that I am speaking with the correct person using two identifiers.  Location: Patient: Virtual Visit Location Patient: Home Provider: Virtual Visit Location Provider: Home Office   I discussed the limitations of evaluation and management by telemedicine and the availability  of in person appointments. The patient expressed understanding and agreed to proceed.    History of Present Illness: Betty Davenport is a 26 y.o. who identifies as a female who was assigned female at birth, and is being seen today for body aches.  States that symptoms started this morning.  Reports associated congestion.  Felt fine yesterday.  Has tried taking mucinex with mild relief. Home covid was positive.  HPI: HPI  Problems:  Patient Active Problem List   Diagnosis Date Noted   Recent URI 08/21/2020   Dysthymia 01/01/2020   Anemia 01/01/2020   Childhood asthma without complication [id number]   Vitamin D insufficiency 06/12/2014   Low serum alkaline phosphatase 02/25/2014   Endocrine function study abnormality 02/25/2014   Short stature 02/25/2014   Allergic rhinitis 04/18/2013   Family problems 01/20/2011   Adjustment disorder with depressed mood 01/20/2011    Allergies: No Known Allergies Medications:  Current Outpatient Medications:    Cetirizine HCl 10 MG CAPS, May take one daily as needed for allergy signs and symptoms., Disp: 90 capsule, Rfl: 1   Cholecalciferol (VITAMIN D PO), Take by mouth., Disp: , Rfl:    fluticasone (FLONASE) 50 MCG/ACT nasal spray, Place 1-2 sprays into both nostrils daily., Disp: 16 g, Rfl: 6   norethindrone-ethinyl estradiol-iron (JUNEL FE 1.5/30) 1.5-30 MG-MCG tablet, Take 1 tablet by mouth daily., Disp: 28 tablet, Rfl:  11   polyethylene glycol powder (GLYCOLAX/MIRALAX) powder, Take 255 g by mouth daily., Disp: 255 g, Rfl: 5   Vitamin D, Ergocalciferol, (DRISDOL) 1.25 MG (50000 UNIT) CAPS capsule, Take 50,000 Units by mouth once a week., Disp: , Rfl:   Observations/Objective: Patient is well-developed, well-nourished in no acute distress.  Resting comfortably at home.  Head is normocephalic, atraumatic.  No labored breathing.  Speech is clear and coherent with logical content.  Patient is alert and oriented at baseline.    Assessment and  Plan: 1. COVID-19  Requests antiviral.  Will trial Molnupiravir since no recent blood work. Supportive care at home.  Follow Up Instructions: I discussed the assessment and treatment plan with the patient. The patient was provided an opportunity to ask questions and all were answered. The patient agreed with the plan and demonstrated an understanding of the instructions.  A copy of instructions were sent to the patient via MyChart unless otherwise noted below.     The patient was advised to call back or seek an in-person evaluation if the symptoms worsen or if the condition fails to improve as anticipated.  Time:  I spent 11 minutes with the patient via telehealth technology discussing the above problems/concerns.    Montine Circle, PA-C

## 2022-02-18 ENCOUNTER — Encounter: Payer: Self-pay | Admitting: Family Medicine

## 2022-02-18 ENCOUNTER — Ambulatory Visit (INDEPENDENT_AMBULATORY_CARE_PROVIDER_SITE_OTHER): Payer: No Typology Code available for payment source | Admitting: Family Medicine

## 2022-02-18 ENCOUNTER — Telehealth: Payer: Self-pay | Admitting: Family Medicine

## 2022-02-18 VITALS — BP 118/72 | HR 83 | Temp 97.1°F | Ht 61.0 in | Wt 114.6 lb

## 2022-02-18 DIAGNOSIS — F4321 Adjustment disorder with depressed mood: Secondary | ICD-10-CM | POA: Diagnosis not present

## 2022-02-18 DIAGNOSIS — Z23 Encounter for immunization: Secondary | ICD-10-CM | POA: Diagnosis not present

## 2022-02-18 DIAGNOSIS — D649 Anemia, unspecified: Secondary | ICD-10-CM | POA: Diagnosis not present

## 2022-02-18 LAB — CBC
HCT: 38.9 % (ref 36.0–46.0)
Hemoglobin: 12.8 g/dL (ref 12.0–15.0)
MCHC: 32.9 g/dL (ref 30.0–36.0)
MCV: 80.9 fl (ref 78.0–100.0)
Platelets: 252 10*3/uL (ref 150.0–400.0)
RBC: 4.81 Mil/uL (ref 3.87–5.11)
RDW: 13.8 % (ref 11.5–15.5)
WBC: 3.9 10*3/uL — ABNORMAL LOW (ref 4.0–10.5)

## 2022-02-18 LAB — B12 AND FOLATE PANEL
Folate: 10.5 ng/mL (ref >5.9)
Vitamin B-12: 229 pg/mL (ref 211–911)

## 2022-02-18 MED ORDER — ESCITALOPRAM OXALATE 10 MG PO TABS: 10.0000 mg | ORAL_TABLET | Freq: Every day | ORAL | 1 refills | Status: DC

## 2022-02-18 NOTE — Progress Notes (Signed)
Established Patient Office Visit  Subjective   Patient ID: Betty Davenport, female    DOB: 09/19/1995  Age: 26 y.o. MRN: [id number]  Chief Complaint  Patient presents with   Annual Exam    CPE, no concerns. Patient fasting.     HPI for physical exam today.  Chart review shows that she had 1 back in July of this year.  Labs were reviewed and okay.  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  She has upcoming interviews for internships.  Things are stressful for her right now.  She is in counseling.  She deals with an ongoing depression with anxiety.  She denies any intention of self-harm but says that she feels as though things would be better if she were not here at times.  Ongoing follow-up with GYN for well woman care.    Review of Systems  Constitutional: Negative.   HENT: Negative.    Eyes:  Negative for blurred vision, discharge and redness.  Respiratory: Negative.    Cardiovascular: Negative.   Gastrointestinal:  Negative for abdominal pain.  Genitourinary: Negative.   Musculoskeletal: Negative.  Negative for myalgias.  Skin:  Negative for rash.  Neurological:  Negative for tingling, loss of consciousness and weakness.  Endo/Heme/Allergies:  Negative for polydipsia.  Psychiatric/Behavioral:  Positive for depression. The patient is nervous/anxious.       02/18/2022   10:33 AM 02/18/2022   10:06 AM 09/02/2020    1:18 PM  Depression screen PHQ 2/9  Decreased Interest 1 0 0  Down, Depressed, Hopeless 1 0 0  PHQ - 2 Score 2 0 0  Altered sleeping 1    Tired, decreased energy 1    Change in appetite 0    Feeling bad or failure about yourself  1    Trouble concentrating 1    Moving slowly or fidgety/restless 1    Suicidal thoughts 1    PHQ-9 Score 8    Difficult doing work/chores Not difficult at all         Objective:     BP 118/72 (BP Location: Right Arm, Patient Position: Sitting, Cuff Size: Normal)    Pulse 83   Temp (!) 97.1 F (36.2 C) (Temporal)   Ht 5\' 1"  (1.549 m)   Wt 114 lb 9.6 oz (52 kg)   SpO2 98%   BMI 21.65 kg/m    Physical Exam Constitutional:      General: She is not in acute distress.    Appearance: Normal appearance. She is not ill-appearing, toxic-appearing or diaphoretic.  HENT:     Head: Normocephalic and atraumatic.     Right Ear: External ear normal.     Left Ear: External ear normal.     Mouth/Throat:     Mouth: Mucous membranes are moist.     Pharynx: Oropharynx is clear. No oropharyngeal exudate or posterior oropharyngeal erythema.  Eyes:     General: No scleral icterus.       Right eye: No discharge.        Left eye: No discharge.     Extraocular Movements: Extraocular movements intact.     Conjunctiva/sclera: Conjunctivae normal.     Pupils: Pupils are equal, round, and reactive to light.  Cardiovascular:     Rate and Rhythm: Normal rate and regular rhythm.  Pulmonary:     Effort: Pulmonary effort is normal. No respiratory distress.     Breath sounds: Normal breath sounds.  Musculoskeletal:     Cervical back: No rigidity or tenderness.  Skin:    General: Skin is warm and dry.  Neurological:     Mental Status: She is alert and oriented to person, place, and time.  Psychiatric:        Mood and Affect: Mood normal.        Behavior: Behavior normal.      No results found for any visits on 02/18/22.    The ASCVD Risk score (Arnett DK, et al., 2019) failed to calculate for the following reasons:   The 2019 ASCVD risk score is only valid for ages 2 to 75    Assessment & Plan:   Problem List Items Addressed This Visit       Other   Adjustment disorder with depressed mood   Relevant Medications   escitalopram (LEXAPRO) 10 MG tablet   Anemia - Primary   Relevant Orders   CBC   Iron, TIBC and Ferritin Panel   B12 and Folate Panel   Need for influenza vaccination   Relevant Orders   Flu Vaccine QUAD 6+ mos PF IM (Fluarix Quad PF)     Return in about 6 weeks (around 04/01/2022).  We will start Lexapro to help with depression and anxiety and sleep.  Information was given on mindfulness based stress reduction.  Libby Maw, MD

## 2022-02-18 NOTE — Telephone Encounter (Signed)
error 

## 2022-02-19 LAB — IRON,TIBC AND FERRITIN PANEL
%SAT: 30 % (calc) (ref 16–45)
Ferritin: 42 ng/mL (ref 16–154)
Iron: 139 ug/dL (ref 40–190)
TIBC: 465 mcg/dL (calc) — ABNORMAL HIGH (ref 250–450)

## 2022-02-24 ENCOUNTER — Telehealth: Payer: Self-pay | Admitting: Family Medicine

## 2022-02-24 NOTE — Telephone Encounter (Signed)
error 

## 2022-02-24 NOTE — Telephone Encounter (Signed)
Error

## 2022-02-26 ENCOUNTER — Encounter: Payer: Self-pay | Admitting: Family Medicine

## 2022-03-17 ENCOUNTER — Other Ambulatory Visit: Payer: Self-pay | Admitting: Family Medicine

## 2022-03-17 DIAGNOSIS — F4321 Adjustment disorder with depressed mood: Secondary | ICD-10-CM

## 2022-03-25 NOTE — Telephone Encounter (Signed)
error 

## 2022-04-01 ENCOUNTER — Encounter: Payer: Self-pay | Admitting: Family Medicine

## 2022-04-01 ENCOUNTER — Ambulatory Visit (INDEPENDENT_AMBULATORY_CARE_PROVIDER_SITE_OTHER): Payer: No Typology Code available for payment source | Admitting: Family Medicine

## 2022-04-01 VITALS — BP 118/74 | HR 78 | Temp 97.8°F | Resp 17 | Wt 115.4 lb

## 2022-04-01 DIAGNOSIS — E611 Iron deficiency: Secondary | ICD-10-CM | POA: Diagnosis not present

## 2022-04-01 DIAGNOSIS — F4321 Adjustment disorder with depressed mood: Secondary | ICD-10-CM | POA: Diagnosis not present

## 2022-04-01 DIAGNOSIS — E538 Deficiency of other specified B group vitamins: Secondary | ICD-10-CM | POA: Insufficient documentation

## 2022-04-01 DIAGNOSIS — E559 Vitamin D deficiency, unspecified: Secondary | ICD-10-CM

## 2022-04-01 LAB — VITAMIN D 25 HYDROXY (VIT D DEFICIENCY, FRACTURES): VITD: 26.13 ng/mL — ABNORMAL LOW (ref 30.00–100.00)

## 2022-04-01 MED ORDER — VITAMIN B-12 1000 MCG PO TABS: 1000.0000 ug | ORAL_TABLET | Freq: Every day | ORAL | 1 refills | Status: DC

## 2022-04-01 NOTE — Progress Notes (Signed)
Established Patient Office Visit  Subjective   Patient ID: Betty Davenport, female    DOB: 1996-03-29  Age: 26 y.o. MRN: [id number]  Chief Complaint  Patient presents with   Follow-up    6 weeks-- pt has no concerns     HPI for follow-up of depression with anxiety and adjustment reaction.  Decided not to start the Lexapro.  She continues biweekly follow-up with her counselor.  Dr. Wyline Mood has her taking baclofen at night as needed for sleep.  She is taking high-dose weekly vitamin D.  Has not started multivitamin with iron or vitamin B12 because of fear of constipation.  Did see what her GYN provider.  She did also follow-up with gastroenterology and is taking Trulance with magnesium.    Review of Systems  Constitutional: Negative.   HENT: Negative.    Eyes:  Negative for blurred vision, discharge and redness.  Respiratory: Negative.    Cardiovascular: Negative.   Gastrointestinal:  Positive for constipation. Negative for abdominal pain.  Genitourinary: Negative.   Musculoskeletal: Negative.  Negative for myalgias.  Skin:  Negative for rash.  Neurological:  Negative for tingling, loss of consciousness and weakness.  Endo/Heme/Allergies:  Negative for polydipsia.      04/01/2022    1:54 PM 02/18/2022   10:33 AM 02/18/2022   10:06 AM  Depression screen PHQ 2/9  Decreased Interest 0 1 0  Down, Depressed, Hopeless 0 1 0  PHQ - 2 Score 0 2 0  Altered sleeping 1 1   Tired, decreased energy 1 1   Change in appetite 0 0   Feeling bad or failure about yourself  0 1   Trouble concentrating 1 1   Moving slowly or fidgety/restless 1 1   Suicidal thoughts 0 1   PHQ-9 Score 4 8   Difficult doing work/chores Not difficult at all Not difficult at all        Objective:     BP 118/74 (BP Location: Left Arm, Patient Position: Sitting, Cuff Size: Normal)   Pulse 78   Temp 97.8 F (36.6 C) (Temporal)   Resp 17   Wt 115 lb 6.4 oz (52.3 kg)   SpO2 98%   BMI 21.80 kg/m    Physical  Exam Constitutional:      General: She is not in acute distress.    Appearance: Normal appearance. She is not ill-appearing, toxic-appearing or diaphoretic.  HENT:     Head: Normocephalic and atraumatic.     Right Ear: External ear normal.     Left Ear: External ear normal.  Eyes:     General: No scleral icterus.       Right eye: No discharge.        Left eye: No discharge.     Extraocular Movements: Extraocular movements intact.     Conjunctiva/sclera: Conjunctivae normal.  Cardiovascular:     Rate and Rhythm: Normal rate and regular rhythm.  Pulmonary:     Effort: Pulmonary effort is normal. No respiratory distress.  Skin:    General: Skin is warm and dry.  Neurological:     Mental Status: She is alert and oriented to person, place, and time.  Psychiatric:        Mood and Affect: Mood normal.        Behavior: Behavior normal.      No results found for any visits on 04/01/22.    The ASCVD Risk score (Arnett DK, et al., 2019) failed to calculate for the following  reasons:   The 2019 ASCVD risk score is only valid for ages 30 to 33    Assessment & Plan:   Problem List Items Addressed This Visit       Other   Adjustment disorder with depressed mood - Primary   Vitamin D insufficiency   Relevant Orders   VITAMIN D 25 Hydroxy (Vit-D Deficiency, Fractures)   Iron deficiency   B12 deficiency   Relevant Medications   cyanocobalamin (VITAMIN B12) 1000 MCG tablet    Return in about 6 months (around 09/30/2022), or if symptoms worsen or fail to improve.  We will continue with a multivitamin with iron and 1000 mcg of B12 daily.  Continue follow-up with counselor.  Continue follow-up with Dr. Wyline Mood for refills on baclofen.  Checking vitamin D levels.  Patient has been taking the high-dose weekly vitamin D pill with no recent levels checked.  Mliss Sax, MD

## 2022-04-02 ENCOUNTER — Telehealth: Payer: Self-pay

## 2022-04-02 NOTE — Telephone Encounter (Signed)
Pt viewed message on MyChart. No questions at this time.

## 2022-04-02 NOTE — Telephone Encounter (Signed)
-----   Message from Mliss Sax, MD sent at 04/02/2022 12:50 PM EST -----   Continue high-dose weekly vitamin D tablet until next office visit.

## 2022-05-31 DIAGNOSIS — K5900 Constipation, unspecified: Secondary | ICD-10-CM | POA: Diagnosis not present

## 2022-09-21 ENCOUNTER — Ambulatory Visit: Payer: No Typology Code available for payment source | Admitting: Family Medicine

## 2022-09-24 ENCOUNTER — Ambulatory Visit: Payer: No Typology Code available for payment source | Admitting: Family Medicine

## 2022-09-28 ENCOUNTER — Telehealth: Payer: Self-pay | Admitting: Family Medicine

## 2022-09-28 ENCOUNTER — Ambulatory Visit: Payer: No Typology Code available for payment source | Admitting: Family Medicine

## 2022-09-28 NOTE — Telephone Encounter (Signed)
Pt NS 5/7 forgot appt. Letter sent

## 2022-10-04 ENCOUNTER — Ambulatory Visit: Payer: No Typology Code available for payment source | Admitting: Family Medicine

## 2022-10-04 ENCOUNTER — Encounter: Payer: Self-pay | Admitting: Family Medicine

## 2022-10-04 VITALS — BP 110/66 | HR 92 | Temp 97.9°F | Ht 61.0 in | Wt 116.4 lb

## 2022-10-04 DIAGNOSIS — E611 Iron deficiency: Secondary | ICD-10-CM | POA: Diagnosis not present

## 2022-10-04 DIAGNOSIS — E559 Vitamin D deficiency, unspecified: Secondary | ICD-10-CM

## 2022-10-04 DIAGNOSIS — Z23 Encounter for immunization: Secondary | ICD-10-CM | POA: Diagnosis not present

## 2022-10-04 DIAGNOSIS — Z0000 Encounter for general adult medical examination without abnormal findings: Secondary | ICD-10-CM

## 2022-10-04 DIAGNOSIS — D649 Anemia, unspecified: Secondary | ICD-10-CM

## 2022-10-04 DIAGNOSIS — E538 Deficiency of other specified B group vitamins: Secondary | ICD-10-CM

## 2022-10-04 LAB — COMPREHENSIVE METABOLIC PANEL WITH GFR
ALT: 12 U/L (ref 0–35)
AST: 13 U/L (ref 0–37)
Albumin: 4.1 g/dL (ref 3.5–5.2)
Alkaline Phosphatase: 26 U/L — ABNORMAL LOW (ref 39–117)
BUN: 12 mg/dL (ref 6–23)
CO2: 25 meq/L (ref 19–32)
Calcium: 9.2 mg/dL (ref 8.4–10.5)
Chloride: 105 meq/L (ref 96–112)
Creatinine, Ser: 0.8 mg/dL (ref 0.40–1.20)
GFR: 102.32 mL/min
Glucose, Bld: 85 mg/dL (ref 70–99)
Potassium: 5 meq/L (ref 3.5–5.1)
Sodium: 139 meq/L (ref 135–145)
Total Bilirubin: 0.3 mg/dL (ref 0.2–1.2)
Total Protein: 7 g/dL (ref 6.0–8.3)

## 2022-10-04 LAB — LIPID PANEL
Cholesterol: 149 mg/dL (ref 0–200)
HDL: 46.1 mg/dL (ref >39.00)
LDL Cholesterol: 88 mg/dL (ref 0–99)
NonHDL: 102.95
Total CHOL/HDL Ratio: 3
Triglycerides: 75 mg/dL (ref 0.0–149.0)
VLDL: 15 mg/dL (ref 0.0–40.0)

## 2022-10-04 LAB — CBC
HCT: 37.3 % (ref 36.0–46.0)
Hemoglobin: 12.2 g/dL (ref 12.0–15.0)
MCHC: 32.6 g/dL (ref 30.0–36.0)
MCV: 80.9 fl (ref 78.0–100.0)
Platelets: 267 10*3/uL (ref 150.0–400.0)
RBC: 4.61 Mil/uL (ref 3.87–5.11)
RDW: 14.2 % (ref 11.5–15.5)
WBC: 4.9 10*3/uL (ref 4.0–10.5)

## 2022-10-04 LAB — VITAMIN D 25 HYDROXY (VIT D DEFICIENCY, FRACTURES): VITD: 19.08 ng/mL — ABNORMAL LOW (ref 30.00–100.00)

## 2022-10-04 LAB — VITAMIN B12: Vitamin B-12: 215 pg/mL (ref 211–911)

## 2022-10-04 MED ORDER — VITAMIN D (ERGOCALCIFEROL) 1.25 MG (50000 UNIT) PO CAPS
50000.0000 [IU] | ORAL_CAPSULE | ORAL | 3 refills | Status: DC
Start: 2022-10-04 — End: 2023-07-13

## 2022-10-04 NOTE — Addendum Note (Signed)
Addended by: Andrez Grime on: 10/04/2022 04:58 PM   Modules accepted: Orders

## 2022-10-04 NOTE — Progress Notes (Addendum)
Established Patient Office Visit   Subjective:  Patient ID: Betty Davenport, female    DOB: 1995-05-29  Age: 27 y.o. MRN: [id number]  Chief Complaint  Patient presents with   Medical Management of Chronic Issues    6 month follow up, no concerns. Patient fasting.     HPI Encounter Diagnoses  Name Primary?   B12 deficiency Yes   Vitamin D insufficiency    Iron deficiency    Anemia, unspecified type    Healthcare maintenance    Immunization due    Doing okay.  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  Contemplating pursuing a masters degree.  Continues multivitamin with iron and high-dose weekly vitamin D tablet.  Up-to-date on well woman care.  Plans to increase exercise levels.   Review of Systems  Constitutional: Negative.   HENT: Negative.    Eyes:  Negative for blurred vision, discharge and redness.  Respiratory: Negative.    Cardiovascular: Negative.   Gastrointestinal:  Negative for abdominal pain.  Genitourinary: Negative.   Musculoskeletal: Negative.  Negative for myalgias.  Skin:  Negative for rash.  Neurological:  Negative for tingling, loss of consciousness and weakness.  Endo/Heme/Allergies:  Negative for polydipsia.     Current Outpatient Medications:    baclofen (LIORESAL) 10 MG tablet, Take 10 mg by mouth at bedtime as needed., Disp: , Rfl:    Cetirizine HCl 10 MG CAPS, May take one daily as needed for allergy signs and symptoms., Disp: 90 capsule, Rfl: 1   Cholecalciferol (VITAMIN D PO), Take by mouth., Disp: , Rfl:    cyanocobalamin (VITAMIN B12) 1000 MCG tablet, Take 1 tablet (1,000 mcg total) by mouth daily., Disp: 90 tablet, Rfl: 1   fluticasone (FLONASE) 50 MCG/ACT nasal spray, Place 1-2 sprays into both nostrils daily., Disp: 16 g, Rfl: 6   norethindrone-ethinyl estradiol-iron (JUNEL FE 1.5/30) 1.5-30 MG-MCG tablet, Take 1 tablet by mouth daily., Disp: 28 tablet, Rfl: 11   polyethylene glycol powder (GLYCOLAX/MIRALAX) powder,  Take 255 g by mouth daily., Disp: 255 g, Rfl: 5   TRULANCE 3 MG TABS, Take 3 mg by mouth daily., Disp: , Rfl:    Vitamin D, Ergocalciferol, (DRISDOL) 1.25 MG (50000 UNIT) CAPS capsule, Take 1 capsule (50,000 Units total) by mouth once a week., Disp: 12 capsule, Rfl: 3   Objective:     BP 110/66 (BP Location: Right Arm, Patient Position: Sitting, Cuff Size: Normal)   Pulse 92   Temp 97.9 F (36.6 C) (Temporal)   Ht 5\' 1"  (1.549 m)   Wt 116 lb 6.4 oz (52.8 kg)   LMP 09/09/2022 (Exact Date)   SpO2 98%   BMI 21.99 kg/m    Physical Exam Constitutional:      General: She is not in acute distress.    Appearance: Normal appearance. She is not ill-appearing, toxic-appearing or diaphoretic.  HENT:     Head: Normocephalic and atraumatic.     Right Ear: External ear normal.     Left Ear: External ear normal.     Mouth/Throat:     Mouth: Mucous membranes are moist.     Pharynx: Oropharynx is clear. No oropharyngeal exudate or posterior oropharyngeal erythema.  Eyes:     General: No scleral icterus.       Right eye: No discharge.        Left eye: No discharge.     Extraocular Movements: Extraocular movements intact.     Conjunctiva/sclera: Conjunctivae normal.  Pupils: Pupils are equal, round, and reactive to light.  Cardiovascular:     Rate and Rhythm: Normal rate and regular rhythm.  Pulmonary:     Effort: Pulmonary effort is normal. No respiratory distress.     Breath sounds: Normal breath sounds.  Abdominal:     General: Bowel sounds are normal.  Musculoskeletal:     Cervical back: No rigidity or tenderness.  Skin:    General: Skin is warm and dry.  Neurological:     Mental Status: She is alert and oriented to person, place, and time.  Psychiatric:        Mood and Affect: Mood normal.        Behavior: Behavior normal.      Results for orders placed or performed in visit on 10/04/22  Vitamin B12  Result Value Ref Range   Vitamin B-12 215 211 - 911 pg/mL  CBC   Result Value Ref Range   WBC 4.9 4.0 - 10.5 K/uL   RBC 4.61 3.87 - 5.11 Mil/uL   Platelets 267.0 150.0 - 400.0 K/uL   Hemoglobin 12.2 12.0 - 15.0 g/dL   HCT 16.1 09.6 - 04.5 %   MCV 80.9 78.0 - 100.0 fl   MCHC 32.6 30.0 - 36.0 g/dL   RDW 40.9 81.1 - 91.4 %  Comprehensive metabolic panel  Result Value Ref Range   Sodium 139 135 - 145 mEq/L   Potassium 5.0 3.5 - 5.1 mEq/L   Chloride 105 96 - 112 mEq/L   CO2 25 19 - 32 mEq/L   Glucose, Bld 85 70 - 99 mg/dL   BUN 12 6 - 23 mg/dL   Creatinine, Ser 7.82 0.40 - 1.20 mg/dL   Total Bilirubin 0.3 0.2 - 1.2 mg/dL   Alkaline Phosphatase 26 (L) 39 - 117 U/L   AST 13 0 - 37 U/L   ALT 12 0 - 35 U/L   Total Protein 7.0 6.0 - 8.3 g/dL   Albumin 4.1 3.5 - 5.2 g/dL   GFR 956.21 >30.86 mL/min   Calcium 9.2 8.4 - 10.5 mg/dL  Lipid panel  Result Value Ref Range   Cholesterol 149 0 - 200 mg/dL   Triglycerides 57.8 0.0 - 149.0 mg/dL   HDL 46.96 >29.52 mg/dL   VLDL 84.1 0.0 - 32.4 mg/dL   LDL Cholesterol 88 0 - 99 mg/dL   Total CHOL/HDL Ratio 3    NonHDL 102.95   VITAMIN D 25 Hydroxy (Vit-D Deficiency, Fractures)  Result Value Ref Range   VITD 19.08 (L) 30.00 - 100.00 ng/mL      The ASCVD Risk score (Arnett DK, et al., 2019) failed to calculate for the following reasons:   The 2019 ASCVD risk score is only valid for ages 32 to 66    Assessment & Plan:   B12 deficiency -     Vitamin B12  Vitamin D insufficiency -     VITAMIN D 25 Hydroxy (Vit-D Deficiency, Fractures) -     Vitamin D (Ergocalciferol); Take 1 capsule (50,000 Units total) by mouth once a week.  Dispense: 12 capsule; Refill: 3  Iron deficiency -     Iron, TIBC and Ferritin Panel  Anemia, unspecified type -     CBC  Healthcare maintenance -     Comprehensive metabolic panel -     Lipid panel -     Urinalysis, Routine w reflex microscopic  Immunization due -     Tdap vaccine greater than or equal to  7yo IM    Return in about 1 year (around 10/04/2023), or if  symptoms worsen or fail to improve.  Adjustments made pending results of today's labs.  Information was given on health maintenance and disease prevention.  Mliss Sax, MD

## 2022-10-05 LAB — URINALYSIS, ROUTINE W REFLEX MICROSCOPIC
Bilirubin Urine: NEGATIVE
Hgb urine dipstick: NEGATIVE
Ketones, ur: NEGATIVE
Leukocytes,Ua: NEGATIVE
Nitrite: NEGATIVE
Specific Gravity, Urine: 1.02 (ref 1.000–1.030)
Total Protein, Urine: NEGATIVE
Urine Glucose: NEGATIVE
Urobilinogen, UA: 0.2 (ref 0.0–1.0)
pH: 7.5 (ref 5.0–8.0)

## 2022-10-05 LAB — IRON,TIBC AND FERRITIN PANEL
%SAT: 13 % (calc) — ABNORMAL LOW (ref 16–45)
Ferritin: 27 ng/mL (ref 16–154)
Iron: 60 ug/dL (ref 40–190)
TIBC: 447 mcg/dL (calc) (ref 250–450)

## 2022-10-05 MED ORDER — IRON (FERROUS SULFATE) 325 (65 FE) MG PO TABS
325.0000 mg | ORAL_TABLET | ORAL | 2 refills | Status: DC
Start: 2022-10-05 — End: 2023-07-13

## 2022-10-05 NOTE — Addendum Note (Signed)
Addended by: Andrez Grime on: 10/05/2022 08:22 AM   Modules accepted: Orders

## 2022-10-18 ENCOUNTER — Other Ambulatory Visit: Payer: Self-pay | Admitting: Family Medicine

## 2022-10-18 DIAGNOSIS — F4321 Adjustment disorder with depressed mood: Secondary | ICD-10-CM

## 2022-10-19 NOTE — Telephone Encounter (Signed)
same day call, pt forgot appt, 1st missed visit, fee waived, letter sent, pt seen 5/13

## 2022-11-19 ENCOUNTER — Other Ambulatory Visit: Payer: Self-pay | Admitting: Family Medicine

## 2022-11-19 DIAGNOSIS — F4321 Adjustment disorder with depressed mood: Secondary | ICD-10-CM

## 2023-04-25 DIAGNOSIS — K5909 Other constipation: Secondary | ICD-10-CM | POA: Diagnosis not present

## 2023-07-12 ENCOUNTER — Ambulatory Visit: Payer: Medicaid Other | Admitting: Family Medicine

## 2023-07-13 ENCOUNTER — Telehealth (HOSPITAL_BASED_OUTPATIENT_CLINIC_OR_DEPARTMENT_OTHER): Payer: Medicaid Other | Admitting: Family Medicine

## 2023-07-13 ENCOUNTER — Encounter (HOSPITAL_BASED_OUTPATIENT_CLINIC_OR_DEPARTMENT_OTHER): Payer: Self-pay | Admitting: Family Medicine

## 2023-07-13 VITALS — BP 121/73 | Ht 61.0 in | Wt 115.0 lb

## 2023-07-13 DIAGNOSIS — J019 Acute sinusitis, unspecified: Secondary | ICD-10-CM

## 2023-07-13 MED ORDER — AMOXICILLIN-POT CLAVULANATE 875-125 MG PO TABS: 1.0000 | ORAL_TABLET | Freq: Two times a day (BID) | ORAL | 0 refills | Status: AC

## 2023-07-13 NOTE — Progress Notes (Signed)
 Virtual Video Visit  I connected with Betty Davenport on 07/13/23 at 10:50 AM EST by a video enabled telemedicine application and verified that I am speaking with the correct person using two identifiers.   Location patient: Home Location provider: Clinic Persons participating in the virtual visit: Patient; Cristy Hilts CMA; Jerre Simon, FNP-C  I discussed the limitations of evaluation and management by telemedicine and the availability of in-person appointments. The patient expressed understanding and agreed to proceed.  Chief Complaint  Patient presents with   URI    Pt says she has had a cold that is not getting any better. Did have a cough which only lasted for about 2 days and then it went away. Does not believe she has been running any fever but states that her body has felt warm. Has not taken a covid test. Has been doing OTC severe cold/flu and also mucinex.    SUBJECTIVE:  HPI:  Betty Davenport is a 28 yo female with hx of allergic rhinitis, vitamin d deficiency, and anemia who presents for concerns of URI .   URI SYMPTOMS: Onset:Reports similar symptoms approx. 1-2 weeks prior which resolved after 3 days but returned within the last week with post nasal drip, cough ongoing for about 2 days.   Fever: No  Nasal congestion: Reports nasal congestion, runny nose when outdoors. Denies sinus pressure or headache.  Post nasal drip: Yes, reports constant post nasal drip  Cough: Reports cough x 2 days that is congested and mildly productive. Denies shortness of breath or wheezing.  Sore throat : Yes   Nausea, Vomiting, Diarrhea: No   Treatments tried: Tylenol Cold and Flu, Mucinex   Recent sick contacts: None    The following portions of the patient's history were reviewed and updated as appropriate: medical history, surgical history, medications, allergies, social history, and family history.    Past Medical History:  Diagnosis Date   Allergy    Urinary tract infection    3  months old   Vision abnormalities    Farsighted   History reviewed. No pertinent surgical history.   Current Outpatient Medications:    amoxicillin-clavulanate (AUGMENTIN) 875-125 MG tablet, Take 1 tablet by mouth 2 (two) times daily for 7 days., Disp: 14 tablet, Rfl: 0   baclofen (LIORESAL) 10 MG tablet, Take 10 mg by mouth at bedtime as needed., Disp: , Rfl:    Cetirizine HCl 10 MG CAPS, May take one daily as needed for allergy signs and symptoms., Disp: 90 capsule, Rfl: 1   fluticasone (FLONASE) 50 MCG/ACT nasal spray, Place 1-2 sprays into both nostrils daily., Disp: 16 g, Rfl: 6   lubiprostone (AMITIZA) 24 MCG capsule, Take 24 mcg by mouth daily with breakfast., Disp: , Rfl:    norethindrone-ethinyl estradiol-iron (JUNEL FE 1.5/30) 1.5-30 MG-MCG tablet, Take 1 tablet by mouth daily., Disp: 28 tablet, Rfl: 11   polyethylene glycol powder (GLYCOLAX/MIRALAX) powder, Take 255 g by mouth daily., Disp: 255 g, Rfl: 5 No Known Allergies  Social History   Socioeconomic History   Marital status: Single    Spouse name: Not on file   Number of children: Not on file   Years of education: Not on file   Highest education level: Bachelor's degree (e.g., BA, AB, BS)  Occupational History   Not on file  Tobacco Use   Smoking status: Never   Smokeless tobacco: Never  Vaping Use   Vaping status: Never Used  Substance and Sexual Activity   Alcohol use:  Yes    Comment: occasionally   Drug use: No   Sexual activity: Not Currently    Birth control/protection: Pill  Other Topics Concern   Not on file  Social History Narrative   Lives with mom and brother (12 yr)   Visits for a few hours with father every other week   Attends [employer] is in 12th grade.   [name] [employer] on attending [employer], wants to study Geographical information systems officer    Social Drivers of Health   Financial Resource Strain: Medium Risk (07/08/2023)   Overall Financial Resource Strain (CARDIA)    Difficulty of  Paying Living Expenses: Somewhat hard  Food Insecurity: Food Insecurity Present (07/08/2023)   Hunger Vital Sign    Worried About Running Out of Food in the Last Year: Sometimes true    Ran Out of Food in the Last Year: Never true  Transportation Needs: No Transportation Needs (07/08/2023)   PRAPARE - Administrator, Civil Service (Medical): No    Lack of Transportation (Non-Medical): No  Physical Activity: Unknown (07/08/2023)   Exercise Vital Sign    Days of Exercise per Week: 5 days    Minutes of Exercise per Session: Patient declined  Stress: Stress Concern Present (07/08/2023)   [employer] of Occupational Health - Occupational Stress Questionnaire    Feeling of Stress : To some extent  Social Connections: Unknown (07/08/2023)   Social Connection and Isolation Panel [NHANES]    Frequency of Communication with Friends and Family: More than three times a week    Frequency of Social Gatherings with Friends and Family: Twice a week    Attends Religious Services: 1 to 4 times per year    Active Member of [employer] or Organizations: No    Attends Engineer, structural: Not on file    Marital Status: Patient declined  Intimate Partner Violence: Not At Risk (03/01/2022)   [facility], [facility]   Humiliation, Afraid, Rape, and Kick questionnaire    Fear of Current or Ex-Partner: No    Emotionally Abused: No    Physically Abused: No    Sexually Abused: No    Family History  Problem Relation Age of Onset   Hypertension Mother    Depression Mother    Anxiety disorder Mother    Cancer Mother        cervical cancer, remission   Brain cancer Mother    Breast cancer Mother    Drug abuse Father        step father   Alcohol abuse Father        step-father   Sarcoidosis Maternal Grandmother    Dementia Maternal Grandmother    Hypothyroidism Maternal Grandmother    Early death Maternal Grandmother        early onset dementia, Alzheimers    Heart disease Maternal Grandmother    Hypertension Maternal Grandmother    Stroke Maternal Grandfather    Arthritis Other    Asthma Neg Hx    Seizures Neg Hx    Diabetes Neg Hx    Hyperlipidemia Neg Hx    Birth defects Neg Hx    COPD Neg Hx    Hearing loss Neg Hx    Kidney disease Neg Hx    Learning disabilities Neg Hx    Mental illness Neg Hx    Mental retardation Neg Hx    Miscarriages / Stillbirths Neg Hx  Vision loss Neg Hx    Varicose Veins Neg Hx      ROS: A complete ROS was performed with pertinent positives/negatives noted in the HPI. The remainder of the ROS are negative.    OBJECTIVE:  VITALS per patient if applicable: Today's Vitals   07/13/23 1100  BP: 121/73  Weight: 115 lb (52.2 kg)  Height: 5\' 1"  (1.549 m)   Body mass index is 21.73 kg/m.   GENERAL: Alert and oriented. Appears well and in no acute distress. HEENT: Atraumatic. Conjunctiva clear. No obvious abnormalities on inspection of external nose and ears. NECK: Normal movements of the head and neck. LUNGS: On inspection, no signs of respiratory distress. Breathing rate appears normal. No obvious gross SOB, gasping or wheezing, and no conversational dyspnea. CV: No obvious cyanosis. MS: Moves all visible extremities without noticeable abnormality. PSYCH/NEURO: Pleasant and cooperative. No obvious depression or anxiety. Speech and thought processing grossly intact.  ASSESSMENT AND PLAN: 1. Acute non-recurrent sinusitis, unspecified location (Primary) Given duration and recurrence of symptoms, will treat for bacterial URI with Augmentin BID x 7 days. Recommend patient begin use of Flonase BID, and antihistamine such as Claritin or Zyrtec. Continue Mucinex as needed. If no improvement or worsening of symptoms, reach out to PCP.   - amoxicillin-clavulanate (AUGMENTIN) 875-125 MG tablet; Take 1 tablet by mouth 2 (two) times daily for 7 days.  Dispense: 14 tablet; Refill: 0  I discussed the assessment  and treatment plan with the patient. The patient was provided an opportunity to ask questions and all were answered. The patient agreed with the plan and demonstrated an understanding of the instructions.   The patient was advised to call back or seek an in-person evaluation if the symptoms worsen or if the condition fails to improve as anticipated.  I provided 8 minutes of non-face-to-face time during this encounter.  Return if symptoms worsen or fail to improve.  Hilbert Bible, [place]

## 2023-10-04 ENCOUNTER — Ambulatory Visit: Payer: No Typology Code available for payment source | Admitting: Family Medicine

## 2023-10-04 ENCOUNTER — Encounter: Payer: Self-pay | Admitting: Family Medicine

## 2023-10-04 VITALS — BP 120/70 | HR 86 | Temp 97.4°F | Ht 61.0 in | Wt 119.2 lb

## 2023-10-04 DIAGNOSIS — E538 Deficiency of other specified B group vitamins: Secondary | ICD-10-CM | POA: Diagnosis not present

## 2023-10-04 DIAGNOSIS — E611 Iron deficiency: Secondary | ICD-10-CM | POA: Diagnosis not present

## 2023-10-04 DIAGNOSIS — D649 Anemia, unspecified: Secondary | ICD-10-CM | POA: Diagnosis not present

## 2023-10-04 DIAGNOSIS — Z13220 Encounter for screening for lipoid disorders: Secondary | ICD-10-CM

## 2023-10-04 DIAGNOSIS — E559 Vitamin D deficiency, unspecified: Secondary | ICD-10-CM

## 2023-10-04 DIAGNOSIS — Z0000 Encounter for general adult medical examination without abnormal findings: Secondary | ICD-10-CM

## 2023-10-04 DIAGNOSIS — J301 Allergic rhinitis due to pollen: Secondary | ICD-10-CM | POA: Diagnosis not present

## 2023-10-04 DIAGNOSIS — F439 Reaction to severe stress, unspecified: Secondary | ICD-10-CM | POA: Diagnosis not present

## 2023-10-04 LAB — URINALYSIS, ROUTINE W REFLEX MICROSCOPIC
Bilirubin Urine: NEGATIVE
Hgb urine dipstick: NEGATIVE
Ketones, ur: NEGATIVE
Nitrite: NEGATIVE
Specific Gravity, Urine: 1.02 (ref 1.000–1.030)
Total Protein, Urine: NEGATIVE
Urine Glucose: NEGATIVE
Urobilinogen, UA: 0.2 (ref 0.0–1.0)
pH: 6 (ref 5.0–8.0)

## 2023-10-04 LAB — IRON,TIBC AND FERRITIN PANEL
%SAT: 16 % (ref 16–45)
Ferritin: 28 ng/mL (ref 16–154)
Iron: 70 ug/dL (ref 40–190)
TIBC: 441 ug/dL (ref 250–450)

## 2023-10-04 MED ORDER — CETIRIZINE HCL 10 MG PO CAPS
ORAL_CAPSULE | ORAL | 1 refills | Status: AC
Start: 2023-10-04 — End: ?

## 2023-10-04 NOTE — Progress Notes (Signed)
 Established Patient Office Visit   Subjective:  Patient ID: Betty Davenport, female    DOB: [birth year]  Age: 28 y.o. MRN: [id number]  Chief Complaint  Patient presents with   Annual Exam    CPE. Pt is not fasting.     HPI Encounter Diagnoses  Name Primary?   Healthcare maintenance Yes   Seasonal allergic rhinitis due to pollen    B12 deficiency    Vitamin D  insufficiency    Iron  deficiency    Anemia, unspecified type    Screening for cholesterol level    Stress at home    For visit: Follow-up of above.  Mostly doing well.  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  She is exercising regularly and regular dental care.  Mom was diagnosed with a brain cancer just before Christmas.  Prognosis is uncertain.  Both she and her mother were 10.  Patient is seeing a counselor for talking therapy.  She is taking the vitamin D  and iron  sporadically.   Review of Systems  Constitutional: Negative.   HENT: Negative.    Eyes:  Negative for blurred vision, discharge and redness.  Respiratory: Negative.    Cardiovascular: Negative.   Gastrointestinal:  Negative for abdominal pain.  Genitourinary: Negative.   Musculoskeletal: Negative.  Negative for myalgias.  Skin:  Negative for rash.  Neurological:  Negative for tingling, loss of consciousness and weakness.  Endo/Heme/Allergies:  Negative for polydipsia.      10/04/2023    1:37 PM 07/13/2023   11:03 AM 10/04/2022    1:18 PM  Depression screen PHQ 2/9  Decreased Interest 1 0 0  Down, Depressed, Hopeless 0 0 0  PHQ - 2 Score 1 0 0  Altered sleeping 2 0   Tired, decreased energy 1 0   Change in appetite 1 0   Feeling bad or failure about yourself  0 0   Trouble concentrating 1 0   Moving slowly or fidgety/restless 1 0   Suicidal thoughts 0 0   PHQ-9 Score 7 0   Difficult doing work/chores Somewhat difficult Not difficult at all       Current Outpatient Medications:    baclofen (LIORESAL)  10 MG tablet, Take 10 mg by mouth at bedtime as needed., Disp: , Rfl:    fluticasone  (FLONASE ) 50 MCG/ACT nasal spray, Place 1-2 sprays into both nostrils daily., Disp: 16 g, Rfl: 6   lubiprostone (AMITIZA) 24 MCG capsule, Take 24 mcg by mouth daily with breakfast., Disp: , Rfl:    norethindrone-ethinyl estradiol -iron  (JUNEL FE 1.5/30) 1.5-30 MG-MCG tablet, Take 1 tablet by mouth daily., Disp: 28 tablet, Rfl: 11   polyethylene glycol powder (GLYCOLAX /MIRALAX ) powder, Take 255 g by mouth daily., Disp: 255 g, Rfl: 5   Cetirizine  HCl 10 MG CAPS, May take one daily as needed for allergy signs and symptoms., Disp: 90 capsule, Rfl: 1   Objective:     BP 120/70 (Cuff Size: Normal)   Pulse 86   Temp (!) 97.4 F (36.3 C) (Temporal)   Ht 5\' 1"  (1.549 m)   Wt 119 lb 3.2 oz (54.1 kg)   SpO2 95%   BMI 22.52 kg/m    Physical Exam Constitutional:      General: She is not in acute distress.    Appearance: Normal appearance. She is not ill-appearing, toxic-appearing or diaphoretic.  HENT:     Head: Normocephalic and atraumatic.     Right Ear: Tympanic membrane, ear  canal and external ear normal.     Left Ear: Tympanic membrane, ear canal and external ear normal.     Mouth/Throat:     Mouth: Mucous membranes are moist.     Pharynx: Oropharynx is clear. No oropharyngeal exudate or posterior oropharyngeal erythema.  Eyes:     General: No scleral icterus.       Right eye: No discharge.        Left eye: No discharge.     Extraocular Movements: Extraocular movements intact.     Conjunctiva/sclera: Conjunctivae normal.     Pupils: Pupils are equal, round, and reactive to light.  Cardiovascular:     Rate and Rhythm: Normal rate and regular rhythm.  Pulmonary:     Effort: Pulmonary effort is normal. No respiratory distress.     Breath sounds: Normal breath sounds. No wheezing, rhonchi or rales.  Abdominal:     General: Bowel sounds are normal.  Musculoskeletal:     Cervical back: No rigidity or  tenderness.  Skin:    General: Skin is warm and dry.  Neurological:     Mental Status: She is alert and oriented to person, place, and time.  Psychiatric:        Mood and Affect: Mood normal.        Behavior: Behavior normal.      No results found for any visits on 10/04/23.    The ASCVD Risk score (Arnett DK, et al., 2019) failed to calculate for the following reasons:   The 2019 ASCVD risk score is only valid for ages 41 to 98    Assessment & Plan:   Healthcare maintenance  Seasonal allergic rhinitis due to pollen -     Cetirizine  HCl; May take one daily as needed for allergy signs and symptoms.  Dispense: 90 capsule; Refill: 1 -     Urinalysis, Routine w reflex microscopic  B12 deficiency -     CBC with Differential/Platelet -     Vitamin B12  Vitamin D  insufficiency -     CBC with Differential/Platelet -     VITAMIN D  25 Hydroxy (Vit-D Deficiency, Fractures)  Iron  deficiency -     CBC with Differential/Platelet -     Iron , TIBC and Ferritin Panel  Anemia, unspecified type -     CBC with Differential/Platelet  Screening for cholesterol level -     Comprehensive metabolic panel with GFR -     LDL cholesterol, direct  Stress at home    Return in about 1 year (around 10/03/2024), or if symptoms worsen or fail to improve, for annual physical.  Continue exercising and talking to counselor.  Information was given on health maintenance and disease prevention.  Further recommendations may pending results of labs  Tonna Frederic, MD

## 2023-10-05 LAB — COMPREHENSIVE METABOLIC PANEL WITH GFR
ALT: 13 U/L (ref 0–35)
AST: 13 U/L (ref 0–37)
Albumin: 4.1 g/dL (ref 3.5–5.2)
Alkaline Phosphatase: 27 U/L — ABNORMAL LOW (ref 39–117)
BUN: 11 mg/dL (ref 6–23)
CO2: 25 meq/L (ref 19–32)
Calcium: 8.9 mg/dL (ref 8.4–10.5)
Chloride: 103 meq/L (ref 96–112)
Creatinine, Ser: 0.73 mg/dL (ref 0.40–1.20)
GFR: 113.41 mL/min (ref >60.00)
Glucose, Bld: 57 mg/dL — ABNORMAL LOW (ref 70–99)
Potassium: 4.1 meq/L (ref 3.5–5.1)
Sodium: 138 meq/L (ref 135–145)
Total Bilirubin: 0.3 mg/dL (ref 0.2–1.2)
Total Protein: 6.8 g/dL (ref 6.0–8.3)

## 2023-10-05 LAB — VITAMIN B12: Vitamin B-12: 238 pg/mL (ref 211–911)

## 2023-10-05 LAB — CBC WITH DIFFERENTIAL/PLATELET
Basophils Absolute: 0 10*3/uL (ref 0.0–0.1)
Basophils Relative: 0.7 % (ref 0.0–3.0)
Eosinophils Absolute: 0.1 10*3/uL (ref 0.0–0.7)
Eosinophils Relative: 2.9 % (ref 0.0–5.0)
HCT: 33.9 % — ABNORMAL LOW (ref 36.0–46.0)
Hemoglobin: 11.1 g/dL — ABNORMAL LOW (ref 12.0–15.0)
Lymphocytes Relative: 45.9 % (ref 12.0–46.0)
Lymphs Abs: 1.8 10*3/uL (ref 0.7–4.0)
MCHC: 32.7 g/dL (ref 30.0–36.0)
MCV: 81.6 fl (ref 78.0–100.0)
Monocytes Absolute: 0.2 10*3/uL (ref 0.1–1.0)
Monocytes Relative: 5 % (ref 3.0–12.0)
Neutro Abs: 1.8 10*3/uL (ref 1.4–7.7)
Neutrophils Relative %: 45.5 % (ref 43.0–77.0)
Platelets: 121 10*3/uL — ABNORMAL LOW (ref 150.0–400.0)
RBC: 4.15 Mil/uL (ref 3.87–5.11)
RDW: 13.8 % (ref 11.5–15.5)
WBC: 3.9 10*3/uL — ABNORMAL LOW (ref 4.0–10.5)

## 2023-10-05 LAB — VITAMIN D 25 HYDROXY (VIT D DEFICIENCY, FRACTURES): VITD: 15.93 ng/mL — ABNORMAL LOW (ref 30.00–100.00)

## 2023-10-05 LAB — LDL CHOLESTEROL, DIRECT: Direct LDL: 95 mg/dL

## 2023-10-06 ENCOUNTER — Ambulatory Visit: Payer: Self-pay | Admitting: Family Medicine

## 2023-12-15 DIAGNOSIS — Z0000 Encounter for general adult medical examination without abnormal findings: Secondary | ICD-10-CM | POA: Diagnosis not present

## 2023-12-15 DIAGNOSIS — Z01419 Encounter for gynecological examination (general) (routine) without abnormal findings: Secondary | ICD-10-CM | POA: Diagnosis not present

## 2024-10-05 ENCOUNTER — Encounter: Admitting: Family Medicine

## 2024-10-09 ENCOUNTER — Encounter: Admitting: Family Medicine
